# Patient Record
Sex: Male | Born: 1945 | Race: Black or African American | Hispanic: No | Smoking: Never smoker
Health system: Southern US, Community
[De-identification: ages and names within clinical notes are randomized; demographics above are authoritative.]

## PROBLEM LIST (undated history)

## (undated) DIAGNOSIS — M1990 Unspecified osteoarthritis, unspecified site: Secondary | ICD-10-CM

## (undated) DIAGNOSIS — I10 Essential (primary) hypertension: Secondary | ICD-10-CM

## (undated) HISTORY — PX: TOTAL KNEE ARTHROPLASTY: SHX125

## (undated) HISTORY — PX: JOINT REPLACEMENT: SHX530

---

## 2001-10-16 ENCOUNTER — Encounter: Payer: Self-pay | Admitting: Family Medicine

## 2001-10-16 ENCOUNTER — Ambulatory Visit (HOSPITAL_COMMUNITY): Admission: RE | Admit: 2001-10-16 | Discharge: 2001-10-16 | Payer: Self-pay | Admitting: Family Medicine

## 2003-04-04 ENCOUNTER — Ambulatory Visit (HOSPITAL_COMMUNITY): Admission: RE | Admit: 2003-04-04 | Discharge: 2003-04-04 | Payer: Self-pay | Admitting: Family Medicine

## 2003-05-08 ENCOUNTER — Ambulatory Visit (HOSPITAL_COMMUNITY): Admission: RE | Admit: 2003-05-08 | Discharge: 2003-05-08 | Payer: Self-pay | Admitting: Family Medicine

## 2003-05-09 ENCOUNTER — Ambulatory Visit (HOSPITAL_COMMUNITY): Admission: RE | Admit: 2003-05-09 | Discharge: 2003-05-09 | Payer: Self-pay | Admitting: Family Medicine

## 2003-10-02 ENCOUNTER — Ambulatory Visit (HOSPITAL_COMMUNITY): Admission: RE | Admit: 2003-10-02 | Discharge: 2003-10-02 | Payer: Self-pay | Admitting: General Surgery

## 2003-10-24 ENCOUNTER — Ambulatory Visit (HOSPITAL_COMMUNITY): Admission: RE | Admit: 2003-10-24 | Discharge: 2003-10-24 | Payer: Self-pay | Admitting: Family Medicine

## 2005-03-16 ENCOUNTER — Encounter (INDEPENDENT_AMBULATORY_CARE_PROVIDER_SITE_OTHER): Payer: Self-pay | Admitting: Family Medicine

## 2005-03-16 LAB — CONVERTED CEMR LAB
PSA: NORMAL ng/mL
Urinalysis: NEGATIVE

## 2005-06-01 ENCOUNTER — Ambulatory Visit: Payer: Self-pay | Admitting: Orthopedic Surgery

## 2005-07-05 ENCOUNTER — Encounter (INDEPENDENT_AMBULATORY_CARE_PROVIDER_SITE_OTHER): Payer: Self-pay | Admitting: *Deleted

## 2005-07-05 ENCOUNTER — Ambulatory Visit: Payer: Self-pay | Admitting: Orthopedic Surgery

## 2005-07-05 ENCOUNTER — Inpatient Hospital Stay (HOSPITAL_COMMUNITY): Admission: RE | Admit: 2005-07-05 | Discharge: 2005-07-12 | Payer: Self-pay | Admitting: Orthopedic Surgery

## 2005-07-11 ENCOUNTER — Ambulatory Visit: Payer: Self-pay | Admitting: Gastroenterology

## 2005-07-12 ENCOUNTER — Encounter (INDEPENDENT_AMBULATORY_CARE_PROVIDER_SITE_OTHER): Payer: Self-pay | Admitting: *Deleted

## 2005-07-18 ENCOUNTER — Ambulatory Visit: Payer: Self-pay | Admitting: Orthopedic Surgery

## 2005-07-28 ENCOUNTER — Emergency Department (HOSPITAL_COMMUNITY): Admission: EM | Admit: 2005-07-28 | Discharge: 2005-07-28 | Payer: Self-pay | Admitting: Emergency Medicine

## 2005-08-11 ENCOUNTER — Ambulatory Visit: Payer: Self-pay | Admitting: Orthopedic Surgery

## 2005-09-20 ENCOUNTER — Ambulatory Visit: Payer: Self-pay | Admitting: Family Medicine

## 2005-09-22 ENCOUNTER — Ambulatory Visit: Payer: Self-pay | Admitting: Orthopedic Surgery

## 2005-10-18 ENCOUNTER — Ambulatory Visit: Payer: Self-pay | Admitting: Family Medicine

## 2005-11-30 ENCOUNTER — Ambulatory Visit: Payer: Self-pay | Admitting: Family Medicine

## 2005-12-07 ENCOUNTER — Encounter: Payer: Self-pay | Admitting: Family Medicine

## 2005-12-07 DIAGNOSIS — Z86718 Personal history of other venous thrombosis and embolism: Secondary | ICD-10-CM

## 2005-12-07 DIAGNOSIS — E785 Hyperlipidemia, unspecified: Secondary | ICD-10-CM | POA: Insufficient documentation

## 2005-12-07 DIAGNOSIS — M1990 Unspecified osteoarthritis, unspecified site: Secondary | ICD-10-CM | POA: Insufficient documentation

## 2005-12-07 DIAGNOSIS — N200 Calculus of kidney: Secondary | ICD-10-CM | POA: Insufficient documentation

## 2005-12-07 DIAGNOSIS — E669 Obesity, unspecified: Secondary | ICD-10-CM

## 2005-12-07 DIAGNOSIS — I10 Essential (primary) hypertension: Secondary | ICD-10-CM | POA: Insufficient documentation

## 2005-12-07 DIAGNOSIS — K429 Umbilical hernia without obstruction or gangrene: Secondary | ICD-10-CM | POA: Insufficient documentation

## 2005-12-22 ENCOUNTER — Ambulatory Visit: Payer: Self-pay | Admitting: Orthopedic Surgery

## 2005-12-29 ENCOUNTER — Ambulatory Visit: Payer: Self-pay | Admitting: Family Medicine

## 2006-01-13 ENCOUNTER — Ambulatory Visit (HOSPITAL_COMMUNITY): Admission: RE | Admit: 2006-01-13 | Discharge: 2006-01-13 | Payer: Self-pay | Admitting: Family Medicine

## 2006-01-30 IMAGING — CR DG KNEE COMPLETE 4+V*R*
4 series · 4 of 4 positions shown · non-contrast
Comparison: none

CLINICAL DATA: Knee pain. No known injury
RIGHT KNEE FOUR VIEWS

[view not recorded (1 of 4)]
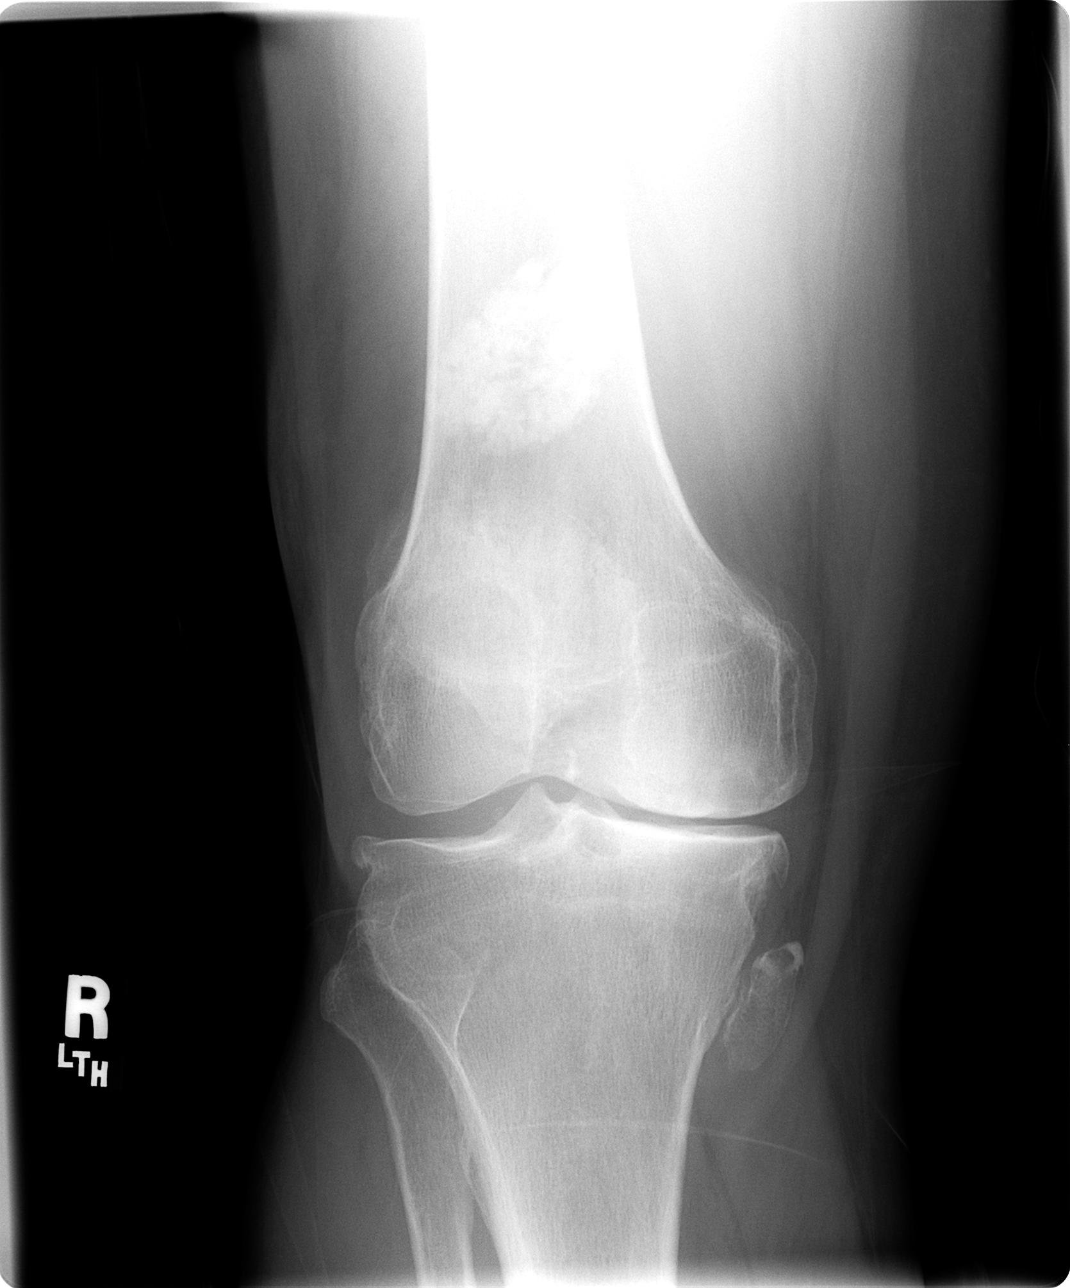

[view not recorded (2 of 4)]
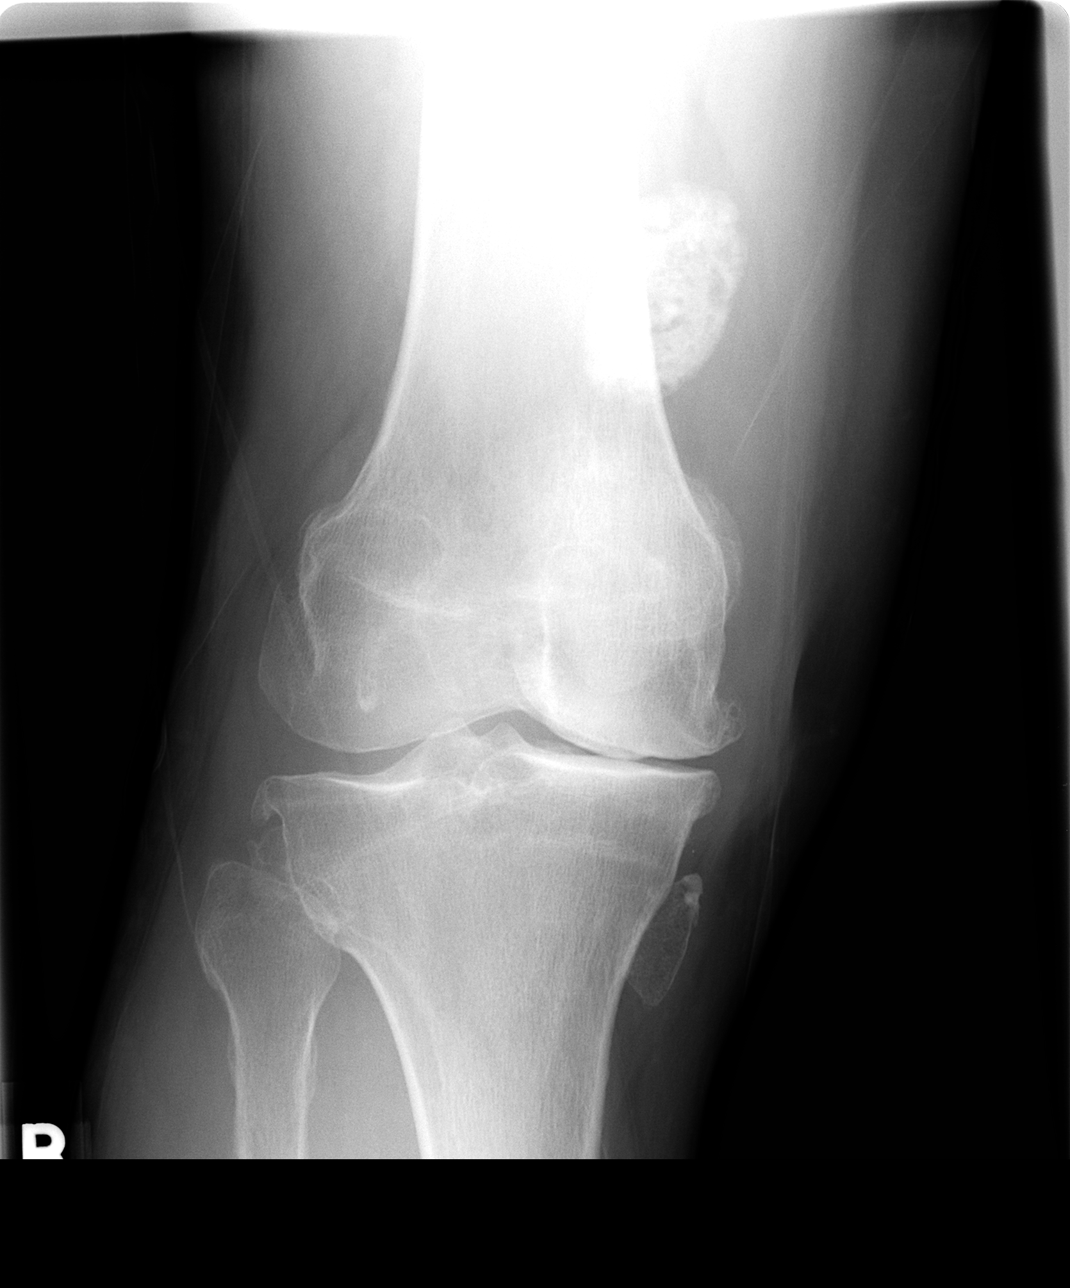

[view not recorded (3 of 4)]
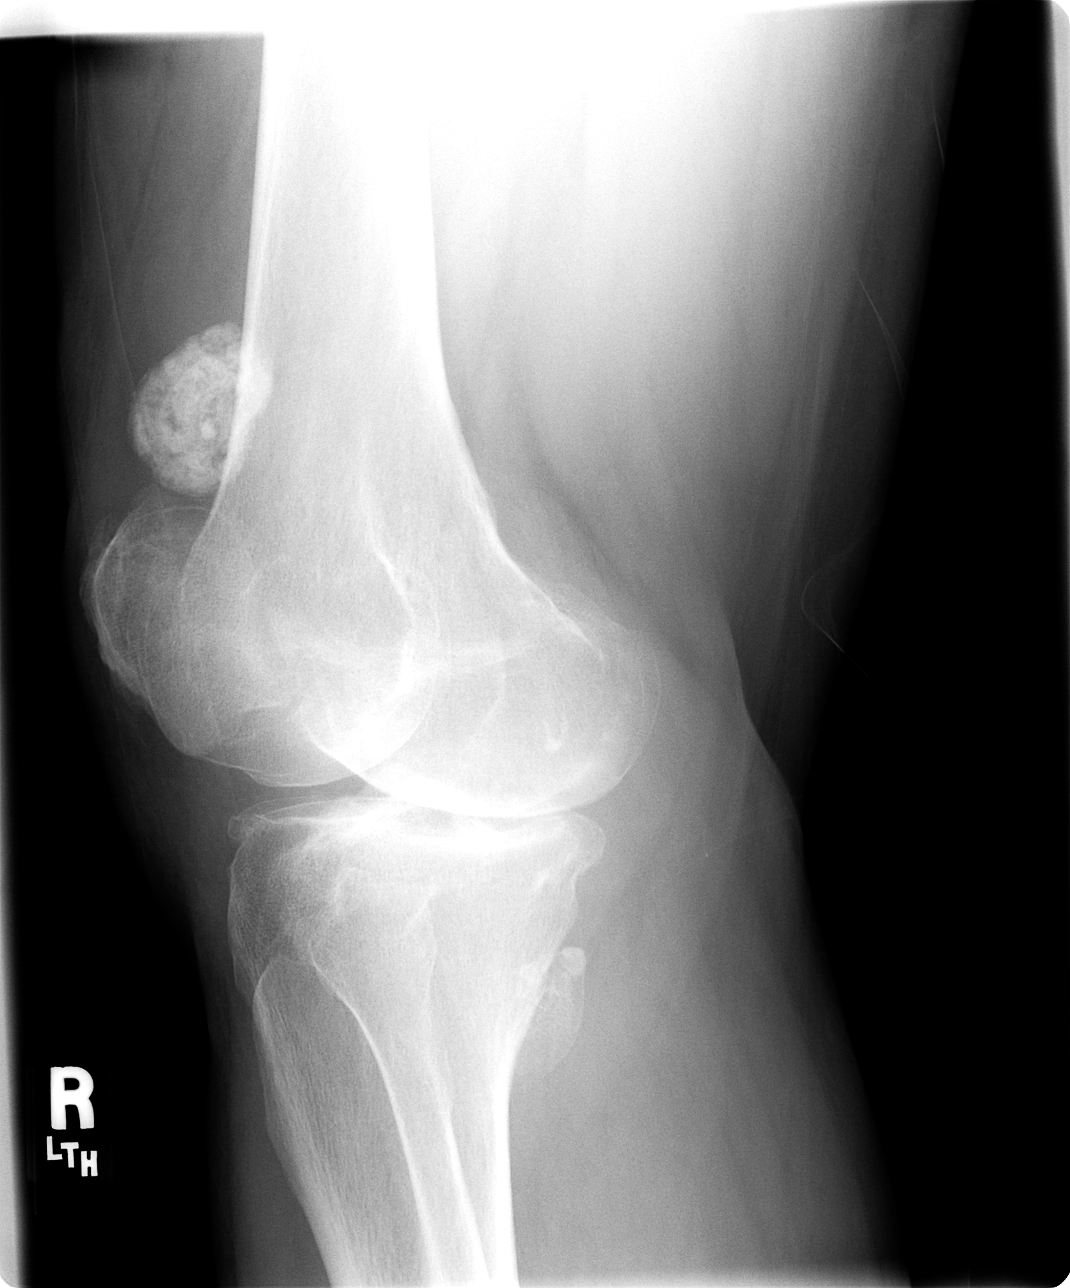

[view not recorded (4 of 4)]
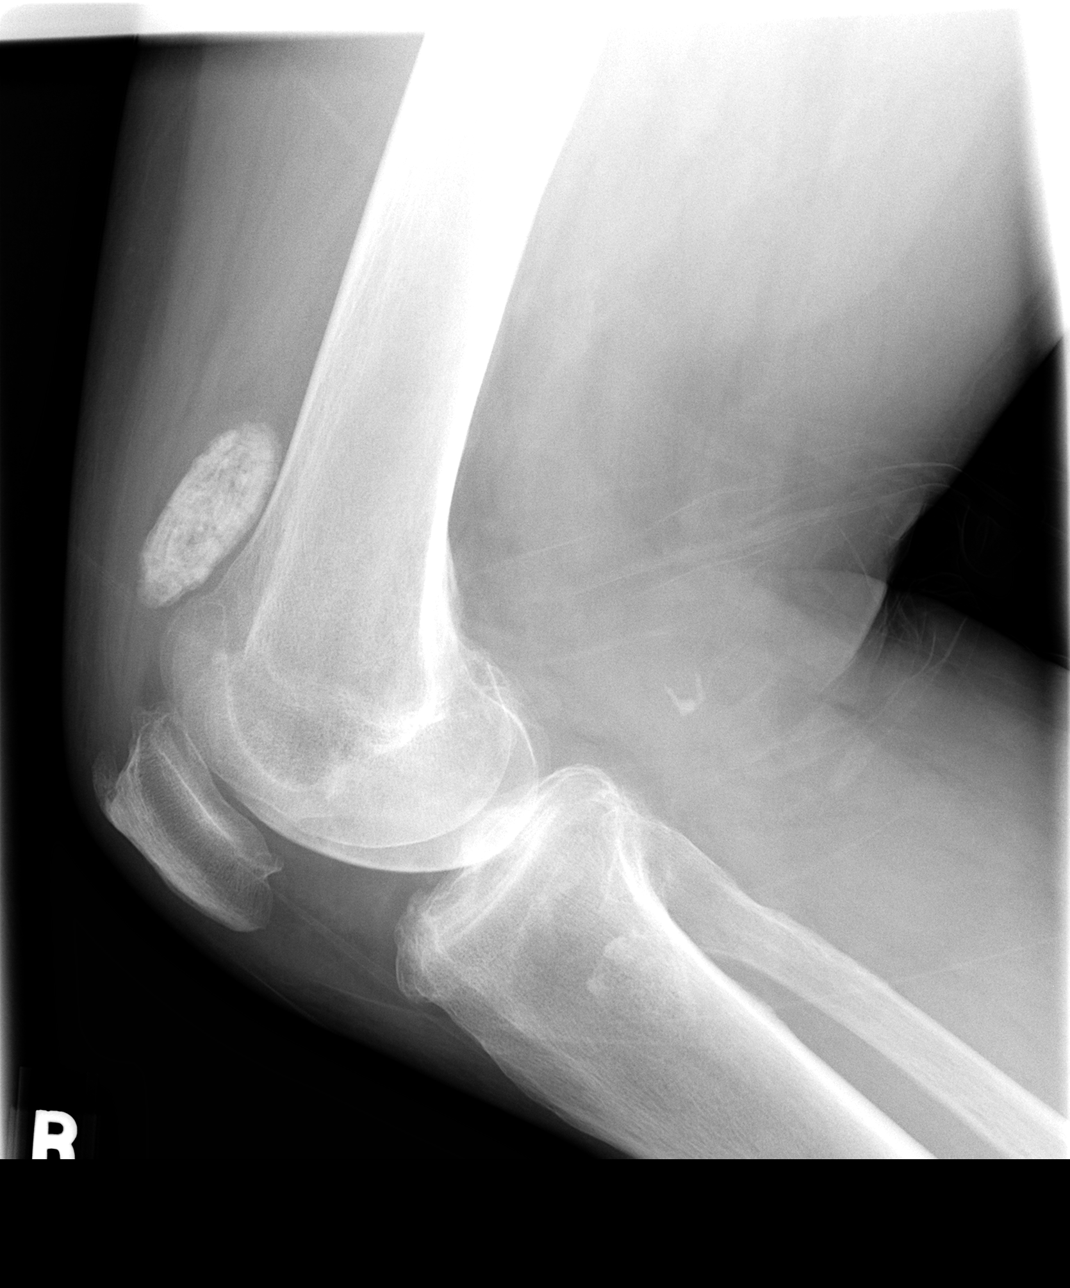

[4 of 4 positions shown; findings below may reference images not displayed]

FINDINGS: There is a 2 x 5 centimeters oval calcified structure in the suprapatellar region likely in the superior aspects of the joint. Moderate severe degenerative changes in the knee are noted manifested by joint space narrowing and osteophytosis primarily in the medial and patellofemoral compartments. Bony density along the medial proximal tibia is likely related to remote injury. There is a suggestion of osteochondral injury/defect in the medial femoral condyle. No definite acute abnormality identified.
IMPRESSION 
2 x 5 cm calcified structure in the superior aspect of the knee joint - likely large loose body. 
Suggestion of osteochondral defect in the medial femoral condyle.
Moderate-severe degenerative changes in the knee.
Evidence of previous remote injury along the medial tibia.

## 2006-02-15 ENCOUNTER — Ambulatory Visit: Payer: Self-pay | Admitting: Family Medicine

## 2006-02-15 ENCOUNTER — Ambulatory Visit (HOSPITAL_COMMUNITY): Admission: RE | Admit: 2006-02-15 | Discharge: 2006-02-15 | Payer: Self-pay | Admitting: Family Medicine

## 2006-02-20 ENCOUNTER — Ambulatory Visit (HOSPITAL_COMMUNITY): Admission: RE | Admit: 2006-02-20 | Discharge: 2006-02-20 | Payer: Self-pay | Admitting: Family Medicine

## 2006-02-20 ENCOUNTER — Encounter (INDEPENDENT_AMBULATORY_CARE_PROVIDER_SITE_OTHER): Payer: Self-pay | Admitting: Family Medicine

## 2006-03-08 ENCOUNTER — Telehealth (INDEPENDENT_AMBULATORY_CARE_PROVIDER_SITE_OTHER): Payer: Self-pay | Admitting: Family Medicine

## 2006-03-23 ENCOUNTER — Ambulatory Visit: Payer: Self-pay | Admitting: Orthopedic Surgery

## 2006-04-20 ENCOUNTER — Ambulatory Visit: Payer: Self-pay | Admitting: Family Medicine

## 2006-04-20 LAB — CONVERTED CEMR LAB
Cholesterol, target level: 200 mg/dL
HDL goal, serum: 40 mg/dL
LDL Goal: 130 mg/dL

## 2006-05-08 ENCOUNTER — Encounter (INDEPENDENT_AMBULATORY_CARE_PROVIDER_SITE_OTHER): Payer: Self-pay | Admitting: Family Medicine

## 2006-05-09 LAB — CONVERTED CEMR LAB
ALT: 16 units/L (ref 0–53)
Alkaline Phosphatase: 73 units/L (ref 39–117)
BUN: 13 mg/dL (ref 6–23)
Glucose, Bld: 82 mg/dL (ref 70–99)
HDL: 50 mg/dL (ref >39)
Potassium: 4.1 meq/L (ref 3.5–5.3)
Total Protein: 7.7 g/dL (ref 6.0–8.3)
Triglycerides: 51 mg/dL (ref <150)

## 2006-06-15 ENCOUNTER — Ambulatory Visit: Payer: Self-pay | Admitting: Family Medicine

## 2006-06-15 DIAGNOSIS — K219 Gastro-esophageal reflux disease without esophagitis: Secondary | ICD-10-CM

## 2006-09-20 ENCOUNTER — Ambulatory Visit: Payer: Self-pay | Admitting: Family Medicine

## 2006-09-21 ENCOUNTER — Telehealth (INDEPENDENT_AMBULATORY_CARE_PROVIDER_SITE_OTHER): Payer: Self-pay | Admitting: *Deleted

## 2006-09-21 LAB — CONVERTED CEMR LAB
ALT: 14 units/L (ref 0–53)
Basophils Absolute: 0.1 10*3/uL (ref 0.0–0.1)
Basophils Relative: 2 % — ABNORMAL HIGH (ref 0–1)
CO2: 21 meq/L (ref 19–32)
Calcium: 8.8 mg/dL (ref 8.4–10.5)
Chloride: 106 meq/L (ref 96–112)
Creatinine, Ser: 0.89 mg/dL (ref 0.40–1.50)
Eosinophils Absolute: 0.1 10*3/uL (ref 0.0–0.7)
Eosinophils Relative: 3 % (ref 0–5)
HCT: 41.9 % (ref 39.0–52.0)
Hemoglobin: 13.3 g/dL (ref 13.0–17.0)
LDL Cholesterol: 127 mg/dL — ABNORMAL HIGH (ref 0–99)
Lymphocytes Relative: 44 % (ref 12–46)
Lymphs Abs: 1.8 10*3/uL (ref 0.7–3.3)
MCHC: 31.7 g/dL (ref 30.0–36.0)
MCV: 90.3 fL (ref 78.0–100.0)
Monocytes Relative: 12 % — ABNORMAL HIGH (ref 3–11)
Neutrophils Relative %: 39 % — ABNORMAL LOW (ref 43–77)
Potassium: 3.7 meq/L (ref 3.5–5.3)
RDW: 13 % (ref 11.5–14.0)
Sodium: 139 meq/L (ref 135–145)
Triglycerides: 56 mg/dL (ref <150)
VLDL: 11 mg/dL (ref 0–40)

## 2006-09-25 ENCOUNTER — Encounter (INDEPENDENT_AMBULATORY_CARE_PROVIDER_SITE_OTHER): Payer: Self-pay | Admitting: Family Medicine

## 2006-11-10 ENCOUNTER — Ambulatory Visit: Payer: Self-pay | Admitting: Family Medicine

## 2006-11-10 DIAGNOSIS — M25539 Pain in unspecified wrist: Secondary | ICD-10-CM | POA: Insufficient documentation

## 2006-11-24 ENCOUNTER — Ambulatory Visit: Payer: Self-pay | Admitting: Family Medicine

## 2006-11-24 ENCOUNTER — Ambulatory Visit (HOSPITAL_COMMUNITY): Admission: RE | Admit: 2006-11-24 | Discharge: 2006-11-24 | Payer: Self-pay | Admitting: Family Medicine

## 2006-11-24 DIAGNOSIS — M25569 Pain in unspecified knee: Secondary | ICD-10-CM

## 2006-11-24 LAB — CONVERTED CEMR LAB
Bilirubin Urine: NEGATIVE
Blood in Urine, dipstick: NEGATIVE
Ketones, urine, test strip: NEGATIVE
Nitrite: NEGATIVE
Specific Gravity, Urine: 1.025
WBC Urine, dipstick: NEGATIVE

## 2006-11-25 LAB — CONVERTED CEMR LAB: Rhuematoid fact SerPl-aCnc: <20 intl units/mL (ref 0–20)

## 2006-11-27 ENCOUNTER — Telehealth (INDEPENDENT_AMBULATORY_CARE_PROVIDER_SITE_OTHER): Payer: Self-pay | Admitting: *Deleted

## 2006-12-04 ENCOUNTER — Encounter (INDEPENDENT_AMBULATORY_CARE_PROVIDER_SITE_OTHER): Payer: Self-pay | Admitting: Family Medicine

## 2006-12-15 ENCOUNTER — Encounter (INDEPENDENT_AMBULATORY_CARE_PROVIDER_SITE_OTHER): Payer: Self-pay | Admitting: Family Medicine

## 2006-12-22 ENCOUNTER — Ambulatory Visit: Payer: Self-pay | Admitting: Family Medicine

## 2007-03-22 ENCOUNTER — Ambulatory Visit: Payer: Self-pay | Admitting: Family Medicine

## 2007-03-23 ENCOUNTER — Encounter (INDEPENDENT_AMBULATORY_CARE_PROVIDER_SITE_OTHER): Payer: Self-pay | Admitting: Family Medicine

## 2007-03-23 ENCOUNTER — Telehealth (INDEPENDENT_AMBULATORY_CARE_PROVIDER_SITE_OTHER): Payer: Self-pay | Admitting: *Deleted

## 2007-03-23 LAB — CONVERTED CEMR LAB
BUN: 19 mg/dL (ref 6–23)
Creatinine, Ser: 1.17 mg/dL (ref 0.40–1.50)
Potassium: 3.9 meq/L (ref 3.5–5.3)

## 2007-04-19 ENCOUNTER — Ambulatory Visit: Payer: Self-pay | Admitting: Family Medicine

## 2007-07-19 ENCOUNTER — Ambulatory Visit: Payer: Self-pay | Admitting: Family Medicine

## 2007-07-19 LAB — CONVERTED CEMR LAB
Nitrite: NEGATIVE
Specific Gravity, Urine: 1.02
WBC Urine, dipstick: NEGATIVE

## 2007-08-28 ENCOUNTER — Encounter (INDEPENDENT_AMBULATORY_CARE_PROVIDER_SITE_OTHER): Payer: Self-pay | Admitting: Family Medicine

## 2007-10-18 ENCOUNTER — Ambulatory Visit: Payer: Self-pay | Admitting: Family Medicine

## 2007-10-19 LAB — CONVERTED CEMR LAB
ALT: 23 units/L (ref 0–53)
Albumin: 4.8 g/dL (ref 3.5–5.2)
Alkaline Phosphatase: 60 units/L (ref 39–117)
CO2: 25 meq/L (ref 19–32)
Creatinine, Ser: 1.2 mg/dL (ref 0.40–1.50)
Eosinophils Relative: 2 % (ref 0–5)
Glucose, Bld: 105 mg/dL — ABNORMAL HIGH (ref 70–99)
LDL Cholesterol: 141 mg/dL — ABNORMAL HIGH (ref 0–99)
Lymphocytes Relative: 45 % (ref 12–46)
Monocytes Absolute: 0.4 10*3/uL (ref 0.1–1.0)
Monocytes Relative: 8 % (ref 3–12)
Neutro Abs: 2.1 10*3/uL (ref 1.7–7.7)
Platelets: 212 10*3/uL (ref 150–400)
Potassium: 4.1 meq/L (ref 3.5–5.3)
Sodium: 136 meq/L (ref 135–145)
Total CHOL/HDL Ratio: 4
Total Protein: 8.5 g/dL — ABNORMAL HIGH (ref 6.0–8.3)
WBC: 4.8 10*3/uL (ref 4.0–10.5)

## 2007-11-14 ENCOUNTER — Ambulatory Visit: Payer: Self-pay | Admitting: Family Medicine

## 2007-11-14 DIAGNOSIS — R7309 Other abnormal glucose: Secondary | ICD-10-CM

## 2007-11-14 DIAGNOSIS — R799 Abnormal finding of blood chemistry, unspecified: Secondary | ICD-10-CM

## 2008-02-13 ENCOUNTER — Ambulatory Visit: Payer: Self-pay | Admitting: Family Medicine

## 2008-02-13 LAB — CONVERTED CEMR LAB
Barbiturate Quant, Ur: NEGATIVE
Benzodiazepines.: NEGATIVE
Cocaine Metabolites: NEGATIVE
Creatinine,U: 184.8 mg/dL
Methadone: NEGATIVE
Propoxyphene: NEGATIVE

## 2008-02-14 ENCOUNTER — Encounter (INDEPENDENT_AMBULATORY_CARE_PROVIDER_SITE_OTHER): Payer: Self-pay | Admitting: *Deleted

## 2008-02-14 LAB — CONVERTED CEMR LAB
ALT: 12 units/L (ref 0–53)
Albumin: 4.3 g/dL (ref 3.5–5.2)
BUN: 18 mg/dL (ref 6–23)
CO2: 27 meq/L (ref 19–32)
Calcium: 9.8 mg/dL (ref 8.4–10.5)
Chloride: 104 meq/L (ref 96–112)
Glucose, Bld: 103 mg/dL — ABNORMAL HIGH (ref 70–99)
Total CHOL/HDL Ratio: 3.3

## 2008-05-14 ENCOUNTER — Ambulatory Visit: Payer: Self-pay | Admitting: Family Medicine

## 2008-05-14 DIAGNOSIS — F528 Other sexual dysfunction not due to a substance or known physiological condition: Secondary | ICD-10-CM | POA: Insufficient documentation

## 2008-05-15 ENCOUNTER — Encounter (INDEPENDENT_AMBULATORY_CARE_PROVIDER_SITE_OTHER): Payer: Self-pay | Admitting: Family Medicine

## 2008-05-15 LAB — CONVERTED CEMR LAB: Uric Acid, Serum: 4.2 mg/dL (ref 4.0–7.8)

## 2008-08-25 ENCOUNTER — Ambulatory Visit: Payer: Self-pay | Admitting: Internal Medicine

## 2008-08-25 ENCOUNTER — Encounter (INDEPENDENT_AMBULATORY_CARE_PROVIDER_SITE_OTHER): Payer: Self-pay | Admitting: Family Medicine

## 2008-08-27 ENCOUNTER — Encounter: Payer: Self-pay | Admitting: Orthopedic Surgery

## 2008-08-27 ENCOUNTER — Ambulatory Visit (HOSPITAL_COMMUNITY): Admission: RE | Admit: 2008-08-27 | Discharge: 2008-08-27 | Payer: Self-pay | Admitting: Family Medicine

## 2008-09-01 ENCOUNTER — Ambulatory Visit: Payer: Self-pay | Admitting: Family Medicine

## 2008-09-04 ENCOUNTER — Ambulatory Visit: Payer: Self-pay | Admitting: Orthopedic Surgery

## 2008-09-04 DIAGNOSIS — M5126 Other intervertebral disc displacement, lumbar region: Secondary | ICD-10-CM

## 2008-09-10 ENCOUNTER — Telehealth (INDEPENDENT_AMBULATORY_CARE_PROVIDER_SITE_OTHER): Payer: Self-pay | Admitting: *Deleted

## 2008-09-10 ENCOUNTER — Telehealth: Payer: Self-pay | Admitting: Orthopedic Surgery

## 2008-10-06 ENCOUNTER — Ambulatory Visit: Payer: Self-pay | Admitting: Orthopedic Surgery

## 2008-10-09 ENCOUNTER — Encounter (INDEPENDENT_AMBULATORY_CARE_PROVIDER_SITE_OTHER): Payer: Self-pay | Admitting: *Deleted

## 2008-10-13 ENCOUNTER — Ambulatory Visit (HOSPITAL_COMMUNITY): Admission: RE | Admit: 2008-10-13 | Discharge: 2008-10-13 | Payer: Self-pay | Admitting: Orthopedic Surgery

## 2008-10-21 ENCOUNTER — Ambulatory Visit: Payer: Self-pay | Admitting: Orthopedic Surgery

## 2008-10-21 DIAGNOSIS — M4800 Spinal stenosis, site unspecified: Secondary | ICD-10-CM

## 2008-10-22 ENCOUNTER — Telehealth: Payer: Self-pay | Admitting: Orthopedic Surgery

## 2009-02-03 ENCOUNTER — Encounter: Payer: Self-pay | Admitting: Orthopedic Surgery

## 2010-02-02 NOTE — Letter (Signed)
Summary: *Orthopedic No Show Letter  [facility]  [address] [place] [address]  [place], [place] [address]   Phone: [phone]  Fax: [phone]     02/03/2009    Johnny Fischer [address] [place], [place]  [address]    Dear Mr. Johnny Fischer,   Our records indicate that you missed your scheduled appointment with Dr. Beaulah Corin on Tuesday, 01/20/2009.  Please contact this office to reschedule your appointment as soon as possible.  It is important that you keep your scheduled appointments with your physician, so we can provide you the best care possible.  We have enclosed an appointment card for your convenience.      Sincerely,    Dr. Terrance Mass, MD [facility] Phone [phone]

## 2010-02-02 NOTE — Miscellaneous (Signed)
Summary: PT order  PT order   Imported By: Cammie Sickle 04/30/2009 19:11:51  _____________________________________________________________________  External Attachment:    Type:   Image     Comment:   External Document

## 2010-05-21 NOTE — Consult Note (Signed)
NAMECAMPBELL, KRAY              ACCOUNT NO.:  [id number]   MEDICAL RECORD NO.:  [id number]          PATIENT TYPE:  INP   LOCATION:  A310                          FACILITY:  [facility]   PHYSICIAN:  Osvaldo Shipper, MD     DATE OF BIRTH:  01-12-1945   DATE OF CONSULTATION:  07/07/2005  DATE OF DISCHARGE:                                   CONSULTATION   REASON FOR CONSULTATION:  Shortness of breath.   REQUESTING PHYSICIAN:  Dr. Romeo Apple, orthopedic surgeon.   CHIEF COMPLAINT:  Shortness of breath, currently resolved.   HISTORY OF PRESENT ILLNESS:  Patient is a 65 year old African-American male  with history of hypertension, dyslipidemia, who was admitted to the hospital  and underwent a total right knee replacement on July 05, 2005.  This  procedure was done by Dr. Romeo Apple.  Patient has been doing well  postoperatively.  He was undergoing physical therapy today when he suddenly  became short of breath.  Did not have any chest pain.  This entire episode  lasted about 20 minutes.  According to the nurse, his saturations did not  drop.  His heart rate did not go up.  It is unclear what happened to his  blood pressure during this episode.  Patient was apparently given a  nebulizer treatment.  Subsequently, he felt better and currently he says he  is almost back to normal.  He does look very anxious at this time.  He says  he has never had this kind of problem before.  He said that he felt like he  was having an asthma attack, though he does not have history of asthma.  No  history of heart disease.  No history of lung disease.  Patient does not  smoke, has never smoked.  He denies any other discomfort anywhere else.  Denies any lightheadedness or any syncopal episodes.   MEDICATION AT HOME INCLUDE THE FOLLOWING:  1.  Gabapentin 100 mg b.i.d.  2.  Famotidine 20 mg daily.  3.  Ibuprofen 800 mg as needed.  4.  Lovastatin 10 mg daily.  5.  Lisinopril/HCTZ 20 mg once daily.   CURRENT  MEDICATIONS AT THE HOSPITAL INCLUDE:  1.  Albuterol nebulizer treatment q.8 h.  2.  Colace 100 mg b.i.d.  3.  Enoxaparin for DVT prophylaxis twice a day.  4.  Famotidine 20 mg once a day.  5.  Ferrous sulfate 325 mg t.i.d.  6.  Gabapentin 100 mg b.i.d.  7.  HCTZ 25 mg daily.  8.  Lisinopril 20 mg daily.  9.  Lovastatin 10 mg daily.  10. Potassium chloride 10 mEq b.i.d.  11. Senokot 1 tablet q.h.s.  12. He is on lactated Ringer's 30 cc per hour and he is on p.r.n.      medications.   ALLERGIES:  NO KNOWN DRUG ALLERGIES.   PAST MEDICAL HISTORY:  Hypertension, dyslipidemia, and arthritis involving  his knees and his back.  He has never had any surgeries prior to his current  surgery.   SOCIAL HISTORY:  He lives in [place].  Lives alone.  Does  not smoke.  No  illicit drug use.  No alcohol use.  His PMD is Dr. Loleta Chance.   FAMILY HISTORY:  Mother has diabetes, otherwise nothing remarkable.   REVIEW OF SYSTEMS:  Unremarkable except as mentioned in the HPI.   PHYSICAL EXAMINATION:  VITAL SIGNS:  Temperature 98.8, heart rate in the  80s, respiratory rate about 16, saturations 100% on 2 liters, blood pressure  137/66.  GENERAL EXAM:  This is an obese male, very anxious but in no distress.  HEENT:  There is no pallor, no icterus.  Oral mucous membranes moist.  No  oral lesions are noted at this time.  LUNGS:  Actually clear to auscultation bilaterally with wheezes, rales, or  rhonchi.  CARDIOVASCULAR:  S1, S2 is normal, regular.  No murmurs appreciated.  No S3,  S4.  No rubs heard.  ABDOMEN:  Soft, nontender, nondistended.  Bowel sounds are present.  No mass  or organomegaly appreciated.  EXTREMITIES:  Right leg appears to be mildly more swollen than the left;  that is the operative site, though.  His knee is covered in a dressing.  He  does have mild edema bilaterally.  He does have some calf discomfort on the  right side.   LABORATORY DATA:  His white count today is 11.8; was 9.3  yesterday.  Hemoglobin 11.8, as well.  MCV is 90, platelet count 152.  Sodium is 130,  potassium is 3.5, chloride is 92, bicarb 30, glucose 107, BUN is 8,  creatinine 1.1, calcium is 8.4.  ABG is not available.   He did have a chest x-ray which reveals low lung volumes with crowding of  pulmonary vasculature.  Radiologist commented that cephalization of blood  flow may indicate pulmonary venous hypertension, otherwise negative.   CT chest has been ordered by Dr. Romeo Apple, which is pending.   EKG has been done which shows sinus rhythm with a normal axis, no Q waves  are present.  There is some poor R wave progression, interval are normal, no  ST or T wave changes of concern are appreciated.   IMPRESSION:  This is a 65 year old African-American male with hypertension,  dyslipidemia, osteoarthritis who is status post right knee replacement for  severe arthritis who had an episode of shortness of breath, which has  resolved spontaneously.  Etiology is unclear at this time.  Differential  include acute chronic obstructive pulmonary disease/acute pulmonary  embolism/panic attack.  His right leg extremity swelling is a little bit  concerning for deep venous thrombosis, although he does not have any real  erythema at this time.  Cardiac etiology for his symptom is less likely as  his lungs are clear at this time and he has not been given any diuretics.   PLAN:  Shortness of breath:  We will await the CT chest results.  In the  meantime, I will obtain a BN peptide, 1 set of cardiac panel, and a D-dimer.  Depending on the CT, we may consider proceeding with a Doppler study of the  right lower extremity as well.  Nebulizer treatment is okay at this time.  Depending on the blood work, further decisions will be taken.  Agree with  DVT prophylaxis, which is ongoing.  His antihypertensive agent should also  be continued as it is being done.  We will continue to follow this patient along with you  and make further  recommendations as needed.  We appreciate Dr. Romeo Apple for allowing [place] to  see this patient.  Osvaldo Shipper, MD  Electronically Signed     GK/MEDQ  D:  07/07/2005  T:  07/07/2005  Job:  [id number]   cc:   Vickki Hearing, M.D.  Fax: [phone]   Annia Friendly. Loleta Chance, MD  Fax: [phone]

## 2010-05-21 NOTE — Op Note (Signed)
NAMECASSON, CATENA              ACCOUNT NO.:  [id number]   MEDICAL RECORD NO.:  [id number]          PATIENT TYPE:  AMB   LOCATION:  DAY                           FACILITY:  [facility]   PHYSICIAN:  Vickki Hearing, M.D.DATE OF BIRTH:  [birth year]   DATE OF PROCEDURE:  07/05/2005  DATE OF DISCHARGE:                                 OPERATIVE REPORT   HISTORY:  A 65 year old male with end-stage osteoarthritis right knee.  He  had complained of grinding sensation in the knee, loss of motion and  inability to perform activities of daily living.  He is been on Vicodin and  analgesics and pain has not been relieved.  He presents for total knee  replacement.   PREOPERATIVE DIAGNOSIS:  Osteoarthritis right knee.   POSTOPERATIVE DIAGNOSIS:  Osteoarthritis right knee.   PROCEDURE:  Right total knee replacement.   IMPLANTS:  Size 5 femur, size 5 tibia 41 three-peg oval dome patella, size  12-5 rotating platform posterior stabilized tibial insert, bone cement.   Inserted Sensorcaine 0.25% pain pump with 300 mL run at 2 mL/hour basal  rate.   OPERATIVE FINDINGS:  There was a large loose body in suprapatellar pouch.  There was severe bone-on-bone changes on the medial compartment, mild  changes lateral and patellofemoral joint.   SURGEON:  Vickki Hearing, M.D.   ASSISTANT:  Adella Hare.   Spinal anesthetic.   SPECIMENS:  Bone fragments sent to path.   COMPLICATIONS:  None.   BLOOD LOSS:  Minimal.   The patient to PACU in good condition.  All counts were reported as correct.   Mr. Hedman right knee was marked for surgery, countersigned by me the  surgeon.  History and physical was updated.  Antibiotics were started.  He  was taken to the operating room for spinal anesthetic.  Foley catheter was  inserted.  Right knee was prepped and draped using sterile technique.  Time-  out procedure was completed.   A straight incision was made with the knee in flexion over the  patella and  deepened through the subcutaneous tissue down to the extensor mechanism and  it was extended proximally and distally.  A medial arthrotomy was performed,  patella was everted.  Medial and lateral menisci, ACL, PCL were resected.  Tibia was subluxated forward.  A 10 mm cut from the lateral side was  referenced from the lateral side in neutral position was made using an  oscillating saw and the tibia measured a size 5.   The femur was drilled through the intramedullary canal with 3/8 inch drill  bit, decompressed with suction and an irrigation.  The femur was measured to  a size 5, distal cut and four cuts were made referencing 10 mm from the  distal femur followed by measuring the femur and then making the four  additional cuts.  Posterior compartment was decompressed with osteotome and  bone fragments were removed.  Extension and flexion gaps were checked and  found to be approximately 12.5 in equal.   The box cut was made after gaps were checked and then patella was  cut from a  26 down to a 14 using a 41 patella.  Three holes were drilled in the patella  and a trial reduction was performed.  I was happy with the stability and  then did a trial reduction using the rotating platform evaluation that was  stable so we drilled and punched the tibia, removed the trial and implants  and irrigated the joint dried the bone, cemented the implants in place and  waited for them to cure.  After the curing we placed a 12.5 posterior  stabilized insert and checked the range of motion with the knee with the  patella stabilized and found to tracking to be normal, stability to be very  good.   We then closed over a pain pump catheter with #1 Bralon with the knee in  flexion.  We used interrupted and running suture.  We 0-0 and 2-0 to close  the subcu and staples to close the skin.  Sterile dressing cryo cuff along  with Ace bandages were applied from the toes to the groin.  The patient  was  taken recovery room in stable condition.  Postop protocol will be routine.      Vickki Hearing, M.D.  Electronically Signed     SEH/MEDQ  D:  07/05/2005  T:  07/05/2005  Job:  [id number]

## 2010-05-21 NOTE — Group Therapy Note (Signed)
NAMELENFORD, BEDDOW              ACCOUNT NO.:  [id number]   MEDICAL RECORD NO.:  [id number]          PATIENT TYPE:  INP   LOCATION:  A310                          FACILITY:  [facility]   PHYSICIAN:  Vickki Hearing, M.D.DATE OF BIRTH:  [birth year]   DATE OF PROCEDURE:  DATE OF DISCHARGE:                                   PROGRESS NOTE   Postop:  Day 4  Right total knee.  Developed right lower extremity DVT documented by  ultrasound.   Medical consult had been obtained for shortness of breath and patient has  been started on a Lovenox/Coumadin protocol.  His potassium is 3.8.  Hemoglobin is 9.6.  Wound looks good.  He is progressing reasonably well  into physical therapy considering his DVT.  He is ambulatory with a walker  and assistance from therapy, and his plan is to go to rehab on Monday.      Vickki Hearing, M.D.  Electronically Signed     SEH/MEDQ  D:  07/09/2005  T:  07/09/2005  Job:  [id number]

## 2010-05-21 NOTE — Discharge Summary (Signed)
NAMETRINIDAD, PETRON              ACCOUNT NO.:  [id number]   MEDICAL RECORD NO.:  [id number]          PATIENT TYPE:  INP   LOCATION:  A310                          FACILITY:  [facility]   PHYSICIAN:  Vickki Hearing, M.D.DATE OF BIRTH:  Oct 25, 1945   DATE OF ADMISSION:  07/05/2005  DATE OF DISCHARGE:  07/09/2007LH                                 DISCHARGE SUMMARY   ADMITTING DIAGNOSIS:  Osteoarthritis right knee.   DISCHARGE DIAGNOSES:  1.  Osteoarthritis right knee.  2.  Deep vein thrombosis right lower extremity.   HISTORY:  Johnny Fischer is 65 years old.  Major medical problems are  hypertension and hypercholesterolemia.  No previous surgery.  He is widowed  and disabled.  Family physician is Dr. Loleta Chance.   He presented with right knee pain, which was greater than left and  osteoarthritis of the knee with end-stage disease and severe pain and  inability to perform activities of daily living in a normal manner.   He was admitted, as I said on July 05, 2005.  He had uncomplicated right  total knee replacement using a DePuy 5 femur and tibia, 41 3 ped oval dome  patella, size 12.5 rotating platform posterior stabilized tibial insert and  it was cemented.  This procedure went uncomplicated under spinal anesthetic.   In the postoperative period, the patient became short of breath on July 07, 2005 and was sent for a CT scan to rule out pulmonary embolus, which was  ruled out.  A medical consult was obtained.  An ultrasound was then obtained  and showed a DVT.  He was started on Lovenox, Coumadin protocol.  Currently,  his PT/INR are 18.6 with an INR of 1.5.  Sodium 129, potassium 4.7.  Hemoglobin is now 8.4.  He is ambulatory up to 100 feet.  CPM is set at 65  degrees.  Pain level varies between 7 to 10 on Tylox.   He will be discharged to a rehabilitation facility on the following  medications:  1.  Colace 100 mg b.i.d.  2.  Senokot 1 tablet p.o. q.h.s.  3.  Iron 325 t.i.d.  4.   Neurontin 100 mg b.i.d.  5.  Pepcid 20 mg daily.  6.  Lisinopril 20 mg daily.  7.  Hydrochlorothiazide 25 mg daily.  8.  Mevacor 10 mg daily.  9.  Potassium 20 mEq b.i.d.  10. Coumadin continue 5 mg daily, titrate up to INR 2.5.  11. Tylox 1 q.4h. p.r.n. pain.   Staples should come out on July 17, 2005.  Followup should be on July 20, 2005.  Phone number [phone] to schedule the time.      Vickki Hearing, M.D.  Electronically Signed     SEH/MEDQ  D:  07/11/2005  T:  07/11/2005  Job:  [id number]

## 2010-05-21 NOTE — Group Therapy Note (Signed)
Johnny Fischer, Johnny Fischer              ACCOUNT NO.:  [id number]   MEDICAL RECORD NO.:  [id number]          PATIENT TYPE:  INP   LOCATION:  A310                          FACILITY:  [facility]   PHYSICIAN:  Vickki Hearing, M.D.DATE OF BIRTH:  1945-10-31   DATE OF PROCEDURE:  07/07/2005  DATE OF DISCHARGE:                                   PROGRESS NOTE   This was his second postoperative day status post right total knee  replacement.  I saw the patient at approximately 1:30 to 1:40 p.m.  He was  doing fine, sitting in a chair, said he was feeling good.  He said his pain  was controlled.  I went to see another patient, and the patient then was  seen by the rapid response team for shortness of breath.  He was sent for a  CT scan with angiography to rule out pulmonary embolus, and he ruled out for  that.  His chest x-ray showed decreased lung volumes and air in his stomach,  although he complained of no abdominal pain and had no distention.   He recovered well and was 100% saturation on room air.  Blood gas is  pending.  His knee looked fine.  He did have some swelling in his right calf  but no tenderness.  We will continue observation.  We let him avoid any  further physical therapy today as his symptoms began when he sat down from a  standing position and became very short of breath and also very anxious.   ASSESSMENT:  Shortness of breath.   PLAN:  Observation after ruling out for a pulmonary embolus.      Vickki Hearing, M.D.  Electronically Signed     SEH/MEDQ  D:  07/07/2005  T:  07/07/2005  Job:  [id number]

## 2010-05-21 NOTE — Group Therapy Note (Signed)
Johnny Fischer, Johnny Fischer              ACCOUNT NO.:  [id number]   MEDICAL RECORD NO.:  [id number]          PATIENT TYPE:  INP   LOCATION:  A310                          FACILITY:  [facility]   PHYSICIAN:  Margaretmary Dys, M.D.DATE OF BIRTH:  [birth year]   DATE OF PROCEDURE:  07/10/2005  DATE OF DISCHARGE:                                   PROGRESS NOTE   SUBJECTIVE:  The patient feels well.  He has very minimal pain in the right  knee.  He has no fever or chills.  We were consulted on the patient due to  acute dyspnea post surgery.  His lung studies did not reveal any evidence of  PE, but he does have deep venous thrombosis for which he is being  anticoagulated for now.   OBJECTIVE:  Conscious, alert, comfortable, not in acute distress.  VITAL SIGNS:  Blood pressure 130/56, pulse of 76, respirations 18,  temperature 98.1.  Oxygen saturation was 97% on room air.  HEENT EXAM:  Normocephalic and atraumatic.  Oral mucosa was moist with no  exudate.  NECK:  Supple, no JVD.  LUNGS:  Clear clinically with good air entry bilaterally.  HEART:  S1-S2 regular, no S3, S4, gallops or murmurs.  ABDOMEN:  Obese, but soft, nontender, bowel sounds positive.  No mass  palpable.  EXTREMITIES:  Swollen right knee, consistent with postoperative changes.  He  also has bilateral pedal edema with right greater than left.   LABORATORY/DIAGNOSTIC DATA:  His white blood cell count was 12.2, hemoglobin  of 8.5, hematocrit 25.4, platelet count was 229 with no left shift.  PT  15.8, INR 1.2.  Sodium 131, potassium of 4, chloride of 92, CO2 of 31,  glucose of 122, BUN of 16, creatinine 1.1, calcium 8.4.   ASSESSMENT AND PLAN:  1.  Deep venous thrombosis of the right leg.  The patient is currently on      Lovenox and Coumadin.  I will give him a 10 mg dose of Coumadin today.      Hopefully the patient will be therapeutic prior to discharge.  2.  Anemia.  We will continue to monitor his H&H, especially in the setting      of full anticoagulation, but I think his drop is likely related to the      surgery.  3.  Acute shortness of breath.  This may have been related to an acute PE,      which spontaneously resolved or recanalized.  We will continue      anticoagulation.  The patient is currently stable.      Margaretmary Dys, M.D.  Electronically Signed     AM/MEDQ  D:  07/10/2005  T:  07/10/2005  Job:  [id number]

## 2010-05-21 NOTE — H&P (Signed)
Johnny Fischer, Johnny Fischer                          ACCOUNT NO.:  [id number]   MEDICAL RECORD NO.:  [id number]                   PATIENT TYPE:   LOCATION:                                       FACILITY:  [facility]   PHYSICIAN:  Dirk Dress. Katrinka Blazing, M.D.                DATE OF BIRTH:  May 16, 1945   DATE OF ADMISSION:  DATE OF DISCHARGE:                                HISTORY & PHYSICAL   HISTORY OF PRESENT ILLNESS:  [age]-year-old male referred for  colonoscopy because of guaiac-positive stools.  The patient denies having  overt rectal bleeding.  He was noted to have strongly guaiac-positive stools  on routine physical exam by his physician, Dr. Evlyn Courier.  He has a  family history of colon cancer.  He is therefore scheduled for screening  colonoscopy.   PAST HISTORY:  He has osteoarthritis, nocturnal leg cramps and hypertension.   MEDICATIONS:  1. Naprosyn 500 mg b.i.d.  2. Quinamm 260 mg q.h.s.   FAMILY HISTORY:  Family history is positive for colon cancer and leukemia.   REVIEW OF SYSTEMS:  Review of systems is positive for pain in his joints and  numbness of his toes.   ALLERGIES:  He has no known drug allergies.   PHYSICAL EXAMINATION:  GENERAL:  On examination, he is a pleasant male in no  acute distress.  VITAL SIGNS:  Blood pressure 120/84, pulse 80 and respirations 16.  Weight  227 pounds.  HEENT:  Unremarkable.  NECK:  Neck supple without JVD or bruit.  CHEST:  Chest clear to auscultation.  HEART:  Regular rate and rhythm without murmur, gallop or rub.  ABDOMEN:  Abdomen soft and nontender.  No masses.  RECTAL:  No masses.  Normal sphincter tone.  Strongly positive stool guaiac.  EXTREMITIES:  No cyanosis, clubbing or edema.  He has crepitus of both knees  but with good range of motion.  NEUROLOGIC:  No focal motor, sensory or cerebellar deficit.   IMPRESSION:  1. Guaiac-positive stools.  2. Family history of colon cancer.  3. Osteoarthritis.  4. Hypertension.   PLAN:  Screening colonoscopy.                                                Dirk Dress. Katrinka Blazing, M.D.    LCS/MEDQ  D:  11/27/2001  T:  11/28/2001  Job:  [id number]

## 2010-05-21 NOTE — Op Note (Signed)
Johnny Fischer, Johnny Fischer              ACCOUNT NO.:  [id number]   MEDICAL RECORD NO.:  [id number]          PATIENT TYPE:  INP   LOCATION:  A310                          FACILITY:  [facility]   PHYSICIAN:  Kassie Mends, M.D.      DATE OF BIRTH:  1945-10-12   DATE OF PROCEDURE:  07/12/2005  DATE OF DISCHARGE:                                 OPERATIVE REPORT   MEDICATIONS:  1.  Demerol 100 mg IV.  2.  Versed 6 mg IV.   INDICATIONS FOR PROCEDURE:  Johnny Fischer is a 65 year old male who was  admitted on July 05, 2005 for right knee replacement.  His hemoglobin trended  down after starting anticoagulation from 11.9 to 8.4.  He had a black stool  while on iron.   FINDINGS:  1.  Multiple gastric erosions, likely source of active oozing on full-      strength anticoagulation.  Biopsies obtained in the antrum for H pylori.  2.  Normal esophagus, duodenal bulb, and duodenum.   RECOMMENDATIONS:  1.  May restart Lovenox and Coumadin.  Would check CBC twice weekly and      transfuse as needed.  If active GI bleed occurs, strongly recommend      discontinuing Lovenox and Coumadin.  The benefits of anticoagulation      outweigh the low risk of significant GI bleed.  I discussed this with      his daughter.  2.  The patient needs a screening colonoscopy as an outpatient once he has      recovered from his right knee replacement.  3.  Follow up biopsies.   DESCRIPTION OF PROCEDURE:  A physical exam was performed, and informed  consent was obtained from the patient after explaining all of the risks  (perforation, bleeding, infection, and adverse effects to medicines),  benefits, and alternatives to the procedure which the patient appeared to  understand and so stated.  The patient was connected to the monitoring  device and placed in the left lateral position.  Continuous oxygen was  provided by nasal cannula and IV medications administered through an  indwelling cannula.  After administration of sedation  as outlined above, the  patient's esophagus was intubated, and the scope was advanced under direct  visualization to the second portion  of the duodenum.  The scope was subsequently removed slowly by carefully  examining the color, texture, anatomy, and integrity of the anatomy on the  way out.  The patient was recovered in the endoscopy suite and discharged to  the floor in satisfactory condition.      Kassie Mends, M.D.  Electronically Signed     SM/MEDQ  D:  07/12/2005  T:  07/12/2005  Job:  [id number]

## 2010-05-21 NOTE — H&P (Signed)
NAMEDEREK, LAUGHTER              ACCOUNT NO.:  [id number]   MEDICAL RECORD NO.:  [id number]          PATIENT TYPE:  AMB   LOCATION:  DAY                           FACILITY:  [facility]   PHYSICIAN:  Jerolyn Shin C. Katrinka Blazing, M.D.   DATE OF BIRTH:  March 20, 1945   DATE OF ADMISSION:  DATE OF DISCHARGE:  LH                                HISTORY & PHYSICAL   A 65 year old male with a history of guaiac positive stools, dating back to  2003.  Colonoscopy was scheduled at that time, but he never had it done.  Repeat examination reveals recurrent guaiac positive stool.  The patient has  a strong family history of colon cancer, his uncle, his father, and his  brother all died of metastatic colon cancer.  The patient is scheduled for  colonoscopy.   PAST HISTORY:  1.  Hypertension.  2.  Osteoarthritis.  3.  Hyperlipidemia.   SURGERY:  None.   MEDICATIONS:  Hydrochlorothiazide 12.5 mg every day.   SOCIAL HISTORY:  He is a widow, disabled.  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  He does not drink, smoke, or use  drugs.   __________  none.   ALLERGIES:  None.   PHYSICAL EXAMINATION:  VITAL SIGNS:  Blood pressure 120/70, pulse 84,  respirations 18, weight 223 pounds.  HEENT:  Unremarkable.  NECK:  Supple.  No JVD or bruit.  CHEST:  Clear to auscultation.  HEART:  Regular rate and rhythm without murmur, gallop or rub.  ABDOMEN:  Soft.  He has moderate tenderness in the left lower quadrant with  guarding.  There is a possible mass effect left lower quadrant.  RECTAL:  Reveals no masses.  Stool is guaiac negative.  EXTREMITIES:  There is no cyanosis, clubbing, or edema.  He has wide based  gait.  He has a tender, swollen, right knee with crepitus.  NEUROLOGIC:  Unremarkable.   IMPRESSION:  1.  Recurrent guaiac positive stool.  2.  Strong family history of metastatic colon cancer.  3.  Hypertension.  4.  Hyperlipidemia.  5.  Osteoarthritis.  6.  Lumbar disk  disease.   PLAN:  Colonoscopy.      LCS/MEDQ  D:  10/01/2003  T:  10/01/2003  Job:  [id number]

## 2010-05-21 NOTE — H&P (Signed)
[name], [name]              ACCOUNT NO.:  [id number]   MEDICAL RECORD NO.:  [id number]          PATIENT TYPE:  AMB   LOCATION:  DAY                           FACILITY:  [facility]   PHYSICIAN:  Vickki Hearing, M.D.DATE OF BIRTH:  [birth year]   DATE OF ADMISSION:  DATE OF DISCHARGE:  LH                                HISTORY & PHYSICAL   CHIEF COMPLAINT:  Right knee pain.   HISTORY:  Johnny Fischer is 65 years old.  He has medical problems of  hypertension and hypercholesterol.  Denies any previous surgery.  Reports  that he is widowed and disabled.  His family physician is Dr. Loleta Fischer.  He has  a family history of asthma, cancer, and diabetes.  He takes Lovastatin,  lisinopril, famotidine, and ibuprofen.  Does not smoke, drink, or use  caffeine, and has no allergies.   He presented with bilateral knee pain right greater than left, some  associated lower back pain which has been present for a year.  He has had  some relief with Vicodin twice a day.  He denies any injury, complains of  right knee swelling, and complains of grinding sensation in the right knee.  He also reports loss of motion and progressively worsening ability to  perform activities of daily living.   The patient has undergone a preoperative consultation regarding total knee  replacement.  He understands the risks and benefits of the procedure  including the risks of doing no surgery and continuing with pain.  He knows  that complications may occur including bleeding, infection, loosening,  instability, pulmonary embolus, DVT.  He understands the treatments for  these complications.   He is 231 pounds with a pulse of 60, respiratory rate of 16.  His HEENT was  essentially within normal limits.  His neck was supple without  lymphadenopathy.  Chest was clear.  Heart rate and rhythm was normal.  Abdomen was soft.  Cardiovascular, peripheral vascular examination showed  normal pulse and perfusion.  Skin was intact,  warm, and dry with no lesions  or open sores.  His neuro findings included normal reflexes, sensation,  coordination.  He was oriented x3.  He was alert and awake.  His right knee  examination revealed only 90 degrees of flexion, painful range of motion,  tenderness medial and lateral compartments, medial more than lateral with  bilateral varus deformity.  Gait shows a marked limp.   Radiographs taken in the office confirm that he does have varus knee with  osteoarthritis.   The patient has a right total knee replacement.      Vickki Hearing, M.D.  Electronically Signed     SEH/MEDQ  D:  07/04/2005  T:  07/04/2005  Job:  [id number]   cc:   [facility]  Fax: [phone]

## 2010-05-21 NOTE — Consult Note (Signed)
Johnny Fischer, Johnny Fischer              ACCOUNT NO.:  [id number]   MEDICAL RECORD NO.:  [id number]          PATIENT TYPE:  INP   LOCATION:  A310                          FACILITY:  [facility]   PHYSICIAN:  Kassie Mends, M.D.      DATE OF BIRTH:  [birth year]   DATE OF CONSULTATION:  07/12/2005  DATE OF DISCHARGE:                                   CONSULTATION   REASON FOR CONSULTATION:  Gastrointestinal bleed.   HPI:  Johnny Fischer is a 65 year old African-American male with a history of  osteoarthritis.  He was admitted on 07/02 for a right-knee replacement.  Prior to his surgery, he was on ibuprofen intermittently.  He denies any  alcohol use.  His postoperative course has been complicated by acute DVT,  which requires him to be on Lovenox and Coumadin.  His preoperative  hemoglobin was 13.3.  His postoperative hemoglobin was 10.8 to 11.8.  Over a  course of four to five days, his hemoglobin trended down to 8.4.  He had  been initiated on iron and there was some concern that he was having black  stools that looked like tar.  Johnny Fischer denies difficulty swallowing,  heartburn, indigestion or abdominal pain.  He denies constipation or  diarrhea.   PAST MEDICAL HISTORY:  Includes hypertension and hyperlipidemia.   PAST SURGICAL HISTORY:  Per the HPI.   ALLERGIES:  He is not allergic to any medicines.   CURRENT MEDICATIONS:  He is currently on Lovenox 100 mg subcu every 12 hours  and Coumadin 5 mg daily.  His additional medications include Albuterol,  Colace, Neurontin, hydrochlorothiazide, iron sulfate, lisinopril,  lovastatin, K-Dur, and Senokot.   FAMILY HISTORY:  He has no family history of colon cancer or colon polyps.   REVIEW OF SYSTEMS:  Reveals no prior colonoscopy.  His review of systems is  as previously mentioned, otherwise all systems are negative.   PHYSICAL EXAMINATION:  VITAL SIGNS:  He is afebrile and hemodynamically  stable.HEENT:  His pupils are equal and reactive to  light and his mouth has  no oral lesions.NECK:  His neck has full range of motion and no  lymphadenopathy.LUNGS:  Clear to auscultation bilaterally.CARDIOVASCULAR  EXAM:  Regular rhythm, no murmur, normal S1 and  S2.ABDOMEN:  Bowel sounds  are present, soft, nontender, nondistended.  No rebound or  guarding.EXTREMITIES:  He has edema of his right lower extremity.  He has no  clubbing, cyanosis or edema.NEUROLOGIC:  He has no focal neurologic  deficits.   LABORATORY DATA:  Platelets 356, INR 1.5.  Creatinine 1.2.   ASSESSMENT:  My assessment is Johnny Fischer is a 65 year old male with a  history of NSAID use and a slowly declining hemoglobin on full  anticoagulation.  The differential diagnosis includes gastritis, gastric  erosions, and a low likelihood of peptic ulcer disease or reflux  esophagitis.  Thank you for allowing me to see Johnny Fischer in consultation.  My list of recommendations follows.   RECOMMENDATIONS:  I recommend EGD for risk assessment due to need for long-  term anticoagulation therapy and decrease of his  hemoglobin on  anticoagulation.  I would hold the Lovenox and the Coumadin.  I would  continue to monitor hemodynamic parameters and would be more than happy to  scope him tonight, if evidence of active GI bleeding occurs.  He should have  colonoscopy for average risk screening as an outpatient when he has fully  recovered from his right-knee replacement.  I agree with continuing twice  daily PPI.      Kassie Mends, M.D.  Electronically Signed     SM/MEDQ  D:  07/12/2005  T:  07/12/2005  Job:  [id number]

## 2011-02-01 ENCOUNTER — Ambulatory Visit: Payer: Self-pay | Admitting: Urology

## 2011-05-17 ENCOUNTER — Ambulatory Visit (INDEPENDENT_AMBULATORY_CARE_PROVIDER_SITE_OTHER): Payer: Medicare Other | Admitting: Urology

## 2011-05-17 DIAGNOSIS — R972 Elevated prostate specific antigen [PSA]: Secondary | ICD-10-CM

## 2011-05-20 ENCOUNTER — Other Ambulatory Visit: Payer: Self-pay | Admitting: Urology

## 2011-05-20 DIAGNOSIS — R972 Elevated prostate specific antigen [PSA]: Secondary | ICD-10-CM

## 2011-05-25 ENCOUNTER — Other Ambulatory Visit (HOSPITAL_COMMUNITY): Payer: Medicare Other

## 2011-07-12 ENCOUNTER — Ambulatory Visit (HOSPITAL_COMMUNITY): Payer: Medicare Other

## 2011-08-30 ENCOUNTER — Ambulatory Visit (HOSPITAL_COMMUNITY): Admission: RE | Admit: 2011-08-30 | Payer: Medicare Other | Source: Ambulatory Visit

## 2014-01-08 ENCOUNTER — Ambulatory Visit (HOSPITAL_COMMUNITY): Admit: 2014-01-08 | Payer: Self-pay | Admitting: Gastroenterology

## 2014-01-08 ENCOUNTER — Encounter (HOSPITAL_COMMUNITY): Payer: Self-pay

## 2014-01-08 SURGERY — COLONOSCOPY WITH PROPOFOL
Anesthesia: Monitor Anesthesia Care

## 2019-11-08 ENCOUNTER — Other Ambulatory Visit: Payer: Self-pay

## 2019-11-08 ENCOUNTER — Ambulatory Visit
Admission: EM | Admit: 2019-11-08 | Discharge: 2019-11-08 | Disposition: A | Discharge status: Home / Self Care | Payer: Medicare HMO | Attending: Urgent Care | Admitting: Urgent Care

## 2019-11-08 DIAGNOSIS — M545 Low back pain, unspecified: Secondary | ICD-10-CM

## 2019-11-08 DIAGNOSIS — R339 Retention of urine, unspecified: Secondary | ICD-10-CM | POA: Diagnosis present

## 2019-11-08 DIAGNOSIS — M5137 Other intervertebral disc degeneration, lumbosacral region: Secondary | ICD-10-CM | POA: Diagnosis present

## 2019-11-08 DIAGNOSIS — R338 Other retention of urine: Secondary | ICD-10-CM | POA: Insufficient documentation

## 2019-11-08 DIAGNOSIS — Z87442 Personal history of urinary calculi: Secondary | ICD-10-CM | POA: Insufficient documentation

## 2019-11-08 DIAGNOSIS — N401 Enlarged prostate with lower urinary tract symptoms: Secondary | ICD-10-CM | POA: Diagnosis present

## 2019-11-08 DIAGNOSIS — M48061 Spinal stenosis, lumbar region without neurogenic claudication: Secondary | ICD-10-CM

## 2019-11-08 LAB — POCT URINALYSIS DIP (MANUAL ENTRY)
Bilirubin, UA: NEGATIVE
Glucose, UA: NEGATIVE mg/dL
Nitrite, UA: POSITIVE — AB
Protein Ur, POC: 100 mg/dL — AB
Spec Grav, UA: >=1.03 — AB (ref 1.010–1.025)
Urobilinogen, UA: 0.2 E.U./dL
pH, UA: 5.5 (ref 5.0–8.0)

## 2019-11-08 MED ORDER — TRAMADOL HCL 50 MG PO TABS: 50.0000 mg | ORAL_TABLET | Freq: Four times a day (QID) | ORAL | 0 refills | Status: DC | PRN

## 2019-11-08 MED ORDER — CIPROFLOXACIN HCL 500 MG PO TABS: 500.0000 mg | ORAL_TABLET | Freq: Two times a day (BID) | ORAL | 0 refills | Status: DC

## 2019-11-08 NOTE — Discharge Instructions (Signed)
Please schedule Tylenol at 500 mg - 650 mg once every 6 hours as needed for aches and pains.  If you still have pain despite taking Tylenol regularly, this is breakthrough pain.  You can use tramadol once every 6 hours for this.  Once your pain is better controlled, switch back to just Tylenol.  Please make sure you follow-up with your back doctor.  If you do not have one, you can contact the back specialist in [place].  Make sure you keep an appointment with [facility].

## 2019-11-08 NOTE — ED Provider Notes (Signed)
George  MRN: [id number] DOB: 26-May-1945  Subjective:   Johnny Fischer is a 74 y.o. male presenting for 2-day history of acute on chronic low back pain that radiates into either buttock.  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  He also has a history of renal colic, BPH, urinary retention.  Currently he is wearing a urinary catheter.  He is actually supposed to be seen at [facility] in [place].  He was prescribed Keflex at the time as well.  He is also on tamsulosin.  Takes Metformin.  Last blood sugar reading was 97 on 10/07/2019, GFR of 72.  No current facility-administered medications for this encounter.  Current Outpatient Medications:  .  tamsulosin (FLOMAX) 0.4 MG CAPS capsule, Take 0.8 mg by mouth daily., Disp: , Rfl:    No Known Allergies  History reviewed. No pertinent past medical history.   History reviewed. No pertinent surgical history.  Family History  Family history unknown: Yes    Social History   Tobacco Use  . Smoking status: Never Smoker  . Smokeless tobacco: Never Used  Substance Use Topics  . Alcohol use: Not Currently  . Drug use: Never    ROS   Objective:   Vitals: BP (!) 203/72   Pulse 66   Temp 98.3 F (36.8 C)   Resp 18   SpO2 94%   BP recheck 176/76.  Physical Exam Constitutional:      General: He is not in acute distress.    Appearance: Normal appearance. He is well-developed. He is obese. He is not ill-appearing, toxic-appearing or diaphoretic.  HENT:     Head: Normocephalic and atraumatic.     Right Ear: External ear normal.     Left Ear: External ear normal.     Nose: Nose normal.     Mouth/Throat:     Mouth: Mucous membranes are moist.     Pharynx: Oropharynx is clear.  Eyes:     General: No scleral icterus.    Extraocular Movements: Extraocular movements intact.     Pupils: Pupils are equal, round,  and reactive to light.  Cardiovascular:     Rate and Rhythm: Normal rate and regular rhythm.     Heart sounds: Normal heart sounds. No murmur heard.  No friction rub. No gallop.   Pulmonary:     Effort: Pulmonary effort is normal. No respiratory distress.     Breath sounds: Normal breath sounds. No stridor. No wheezing, rhonchi or rales.  Abdominal:     General: Bowel sounds are normal. There is no distension.     Palpations: Abdomen is soft. There is no mass.     Tenderness: There is no abdominal tenderness. There is no guarding or rebound.  Musculoskeletal:     Lumbar back: Spasms and tenderness (over areas outlined) present. No swelling, edema, deformity, signs of trauma, lacerations or bony tenderness. Decreased range of motion. Negative right straight leg raise test and negative left straight leg raise test. No scoliosis.       Back:     Comments: Strength 5/5 for lower extremities, symmetric.  Skin:    General: Skin is warm and dry.  Neurological:     Mental Status: He is alert and oriented to person, place, and time.     Motor: No weakness.     Coordination: Coordination normal.     Gait: Gait normal.  Psychiatric:  Mood and Affect: Mood normal.        Behavior: Behavior normal.        Thought Content: Thought content normal.        Judgment: Judgment normal.     Results for orders placed or performed during the hospital encounter of 11/08/19 (from the past 24 hour(s))  POCT urinalysis dipstick     Status: Abnormal   Collection Time: 11/08/19  6:34 PM  Result Value Ref Range   Color, UA brown (A) yellow   Clarity, UA turbid (A) clear   Glucose, UA negative negative mg/dL   Bilirubin, UA negative negative   Ketones, POC UA trace (5) (A) negative mg/dL   Spec Grav, UA >=1.030 (A) 1.010 - 1.025   Blood, UA large (A) negative   pH, UA 5.5 5.0 - 8.0   Protein Ur, POC =100 (A) negative mg/dL   Urobilinogen, UA 0.2 0.2 or 1.0 E.U./dL   Nitrite, UA Positive (A)  Negative   Leukocytes, UA Moderate (2+) (A) Negative    Assessment and Plan :   PDMP not reviewed this encounter.  1. Acute bilateral low back pain without sciatica   2. Spinal stenosis of lumbar region without neurogenic claudication   3. DDD (degenerative disc disease), lumbosacral   4. Urinary retention   5. History of renal stone   6. Benign prostatic hyperplasia with urinary retention     Suspect acute on chronic back pain, recommended scheduling Tylenol, tramadol for breakthrough pain.  Provided him with information to follow-up with back specialist.  Regarding his urinalysis, will cover for ongoing urinary tract infection secondary to urinary retention.  He failed treatment with Keflex and therefore will use ciprofloxacin, urine culture pending.  Emphasized need for follow-up with [facility]. Counseled patient on potential for adverse effects with medications prescribed/recommended today, ER and return-to-clinic precautions discussed, patient verbalized understanding.    Jaynee Eagles, [place] 11/08/19 1838

## 2019-11-08 NOTE — ED Triage Notes (Signed)
Pt presents with c/o low back pain that began 2 days ago, denies injury

## 2019-11-10 LAB — URINE CULTURE: Culture: >=100000 — AB

## 2019-11-11 LAB — URINE CULTURE

## 2019-12-04 DIAGNOSIS — O2230 Deep phlebothrombosis in pregnancy, unspecified trimester: Secondary | ICD-10-CM

## 2019-12-04 HISTORY — DX: Deep phlebothrombosis in pregnancy, unspecified trimester: O22.30

## 2019-12-13 ENCOUNTER — Encounter (HOSPITAL_COMMUNITY): Payer: Self-pay | Admitting: *Deleted

## 2019-12-13 ENCOUNTER — Inpatient Hospital Stay (HOSPITAL_COMMUNITY)
Admission: EM | Admit: 2019-12-13 | Discharge: 2019-12-13 | DRG: 726 | Discharge status: Against Medical Advice | Payer: Medicare Other | Attending: Family Medicine | Admitting: Family Medicine

## 2019-12-13 ENCOUNTER — Ambulatory Visit
Admission: EM | Admit: 2019-12-13 | Discharge: 2019-12-13 | Disposition: A | Discharge status: Home / Self Care | Payer: Medicare Other | Source: Home / Self Care

## 2019-12-13 ENCOUNTER — Other Ambulatory Visit: Payer: Self-pay

## 2019-12-13 ENCOUNTER — Encounter: Payer: Self-pay | Admitting: Emergency Medicine

## 2019-12-13 ENCOUNTER — Emergency Department (HOSPITAL_COMMUNITY): Payer: Medicare Other

## 2019-12-13 DIAGNOSIS — N401 Enlarged prostate with lower urinary tract symptoms: Secondary | ICD-10-CM | POA: Diagnosis not present

## 2019-12-13 DIAGNOSIS — G834 Cauda equina syndrome: Secondary | ICD-10-CM | POA: Diagnosis present

## 2019-12-13 DIAGNOSIS — Z79899 Other long term (current) drug therapy: Secondary | ICD-10-CM

## 2019-12-13 DIAGNOSIS — Z5329 Procedure and treatment not carried out because of patient's decision for other reasons: Secondary | ICD-10-CM | POA: Diagnosis not present

## 2019-12-13 DIAGNOSIS — M48061 Spinal stenosis, lumbar region without neurogenic claudication: Secondary | ICD-10-CM | POA: Diagnosis present

## 2019-12-13 DIAGNOSIS — M545 Low back pain, unspecified: Secondary | ICD-10-CM | POA: Diagnosis present

## 2019-12-13 DIAGNOSIS — R338 Other retention of urine: Secondary | ICD-10-CM | POA: Diagnosis present

## 2019-12-13 DIAGNOSIS — M1990 Unspecified osteoarthritis, unspecified site: Secondary | ICD-10-CM | POA: Diagnosis not present

## 2019-12-13 DIAGNOSIS — N3945 Continuous leakage: Secondary | ICD-10-CM | POA: Insufficient documentation

## 2019-12-13 DIAGNOSIS — R339 Retention of urine, unspecified: Secondary | ICD-10-CM

## 2019-12-13 DIAGNOSIS — Z20822 Contact with and (suspected) exposure to covid-19: Secondary | ICD-10-CM | POA: Diagnosis present

## 2019-12-13 HISTORY — DX: Unspecified osteoarthritis, unspecified site: M19.90

## 2019-12-13 LAB — ACETAMINOPHEN LEVEL: Acetaminophen (Tylenol), Serum: 19 ug/mL (ref 10–30)

## 2019-12-13 LAB — RESP PANEL BY RT-PCR (FLU A&B, COVID) ARPGX2
Influenza A by PCR: NEGATIVE
Influenza B by PCR: NEGATIVE
SARS Coronavirus 2 by RT PCR: NEGATIVE

## 2019-12-13 LAB — URINALYSIS, ROUTINE W REFLEX MICROSCOPIC
Bilirubin Urine: NEGATIVE
Glucose, UA: NEGATIVE mg/dL
Hgb urine dipstick: NEGATIVE
Ketones, ur: NEGATIVE mg/dL
Leukocytes,Ua: NEGATIVE
Nitrite: NEGATIVE
Protein, ur: NEGATIVE mg/dL
Specific Gravity, Urine: 1.012 (ref 1.005–1.030)
pH: 5 (ref 5.0–8.0)

## 2019-12-13 LAB — POCT URINALYSIS DIP (MANUAL ENTRY)
Bilirubin, UA: NEGATIVE
Glucose, UA: NEGATIVE mg/dL
Ketones, POC UA: NEGATIVE mg/dL
Leukocytes, UA: NEGATIVE
Nitrite, UA: NEGATIVE
Protein Ur, POC: NEGATIVE mg/dL
Spec Grav, UA: 1.02 (ref 1.010–1.025)
Urobilinogen, UA: 0.2 E.U./dL
pH, UA: 5.5 (ref 5.0–8.0)

## 2019-12-13 LAB — BASIC METABOLIC PANEL
Anion gap: 12 (ref 5–15)
BUN: 13 mg/dL (ref 8–23)
CO2: 25 mmol/L (ref 22–32)
Calcium: 9.7 mg/dL (ref 8.9–10.3)
Chloride: 102 mmol/L (ref 98–111)
Creatinine, Ser: 1.25 mg/dL — ABNORMAL HIGH (ref 0.61–1.24)
GFR, Estimated: >60 mL/min (ref >60)
Glucose, Bld: 108 mg/dL — ABNORMAL HIGH (ref 70–99)
Potassium: 4.1 mmol/L (ref 3.5–5.1)
Sodium: 139 mmol/L (ref 135–145)

## 2019-12-13 LAB — CBC
HCT: 42.1 % (ref 39.0–52.0)
Hemoglobin: 13.1 g/dL (ref 13.0–17.0)
MCH: 28.2 pg (ref 26.0–34.0)
MCHC: 31.1 g/dL (ref 30.0–36.0)
MCV: 90.7 fL (ref 80.0–100.0)
Platelets: 194 10*3/uL (ref 150–400)
RBC: 4.64 MIL/uL (ref 4.22–5.81)
RDW: 13.1 % (ref 11.5–15.5)
WBC: 10.4 10*3/uL (ref 4.0–10.5)
nRBC: 0 % (ref 0.0–0.2)

## 2019-12-13 MED ORDER — GADOBUTROL 1 MMOL/ML IV SOLN
10.0000 mL | Freq: Once | INTRAVENOUS | Status: AC | PRN
  Administered 2019-12-13: 10 mL via INTRAVENOUS

## 2019-12-13 MED ORDER — DEXAMETHASONE SODIUM PHOSPHATE 10 MG/ML IJ SOLN
12.0000 mg | Freq: Once | INTRAMUSCULAR | Status: AC
  Administered 2019-12-13: 12 mg via INTRAVENOUS
  Filled 2019-12-13: qty 2

## 2019-12-13 NOTE — ED Notes (Signed)
Pt in MRI at this time 

## 2019-12-13 NOTE — ED Triage Notes (Addendum)
Lower abd pain, burning with urination , urinary frequency and low back pain since yesterday.  Pt had urinary catheter in place for past 3 months  for urinary retention.  Pt states he removed the catheter himself x 1 month ago

## 2019-12-13 NOTE — ED Triage Notes (Signed)
Urinary incontinence with flank pain onset yesterday

## 2019-12-13 NOTE — ED Notes (Signed)
ED Provider at bedside. 

## 2019-12-13 NOTE — ED Notes (Signed)
Hospitalist at bedside 

## 2019-12-13 NOTE — H&P (Incomplete)
TRH H&P    Patient Demographics:    Johnny Fischer, is a 74 y.o. male  MRN: [id number]  DOB - Oct 19, 1945  Admit Date - 12/13/2019  Referring MD/NP/PA: Donna Christen  Outpatient Primary MD for the patient is Patient, No Pcp Per  Patient coming from: Home  Chief complaint-back pain   HPI:    Johnny Fischer  is a 74 y.o. male, with history of arthritis presents to the ER with a chief complaint of back pain.  Patient has caretaker at bedside who believes that pain started when patient was helping to push a truck up onto a ramp.  Patient does admit that the pain did start in the same day which was 2 days ago.  He cannot characterize the pain and reports it is just hurting real bad.  It was a 10 out of 10 when it started but has improved since being in the ER.  He reports associated saddle anesthesia.  He took 6 Tylenol at one time, and reports that the pain was improved as well as the saddle anesthesia.  The numbness did not recur, but the pain came back today so patient went to the urgent care.  Urgent care sent him here.  Patient reports that he has had no bowel incontinence.  He reports urinary incontinence described as dribbling.  He reports dysuria that started yesterday.  He does not report decreased urinary output, even though there is clear urinary retention with a bladder scan of greater than 1 L, and 1.5 L out with Foley catheter.  Patient reports that he has been walking normally.  He has normal sensation in the entirety of his legs at this time.  He reports he does drag his toe greater on the left than right, but that that is chronic.  Patient has no other complaints at this time.  In the ED Temp 98.2, heart rate 66-72, respiratory rate 18-20, blood pressure 157/74, 96% on room air Hematology showed leukocytosis with a white blood cell count of 10.4, hemoglobin 13.1 Chemistry panel reveals a bump in creatinine at  1.25-last creatinine was 0.93 but this was in 2010.  1.25 is likely his new baseline MRI lumbar showed a T1 hypodense enhancing lesion involving S3 and S4 with small epidural component concerning for malignancy.  Soft tissue enhancing lesion seen posterior to sacrum on left.  Further evaluation with dedicated MRI of pelvis and sacrum recommended.  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  Severe right neural foraminal narrowing of L4 and L5 and severe neural foraminal narrowing of L5 and S1. Decadron started Neurosurgery was consulted from the ER and recommends admission to [facility] for dedicated MRIs and in person consult    Review of systems:    In addition to the HPI above,  No Fever-chills, No Headache, No changes with Vision or hearing, No problems swallowing food or Liquids, No Chest pain, Cough or Shortness of Breath, No Abdominal pain, No Nausea or Vomiting, bowel movements  are regular, No Blood in stool or Urine, No dysuria, No new skin rashes or bruises, No new joints pains-aches,  No new weakness, tingling, numbness in any extremity, No recent weight gain or loss, No polyuria, polydypsia or polyphagia, No significant Mental Stressors.  All other systems reviewed and are negative.    Past History of the following :    Past Medical History:  Diagnosis Date  . Arthritis       History reviewed. No pertinent surgical history.    Social History:      Social History   Tobacco Use  . Smoking status: Never Smoker  . Smokeless tobacco: Never Used  Substance Use Topics  . Alcohol use: Not Currently       Family History :     Family History  Family history unknown: Yes   ***   Home Medications:   Prior to Admission medications   Medication Sig Start Date End Date Taking? Authorizing Provider  ciprofloxacin (CIPRO) 500 MG tablet Take 1 tablet (500 mg total) by mouth every 12 (twelve)  hours. 11/08/19   Jaynee Eagles, PA-C  tamsulosin (FLOMAX) 0.4 MG CAPS capsule Take 0.8 mg by mouth daily. 10/12/19   [provider]  traMADol (ULTRAM) 50 MG tablet Take 1 tablet (50 mg total) by mouth every 6 (six) hours as needed. 11/08/19   Jaynee Eagles, PA-C     Allergies:    No Known Allergies   Physical Exam:   Vitals  Blood pressure (!) 157/74, pulse 72, temperature 99.4 F (37.4 C), temperature source Oral, resp. rate 18, height 5\' 9"  (1.753 m), weight 117.9 kg, SpO2 96 %.  1.  General:  ***  2. Psychiatric: ***  3. Neurologic: ***  4. HEENMT:  ***  5. Respiratory : ***  6. Cardiovascular : ***  7. Gastrointestinal:  ***  8. Skin:  ***  9.Musculoskeletal:  ***    Data Review:    CBC Recent Labs  Lab 12/13/19 1256  WBC 10.4  HGB 13.1  HCT 42.1  PLT 194  MCV 90.7  MCH 28.2  MCHC 31.1  RDW 13.1   ------------------------------------------------------------------------------------------------------------------  Results for orders placed or performed during the hospital encounter of 12/13/19 (from the past 48 hour(s))  Basic metabolic panel     Status: Abnormal   Collection Time: 12/13/19 12:56 PM  Result Value Ref Range   Sodium 139 135 - 145 mmol/L   Potassium 4.1 3.5 - 5.1 mmol/L   Chloride 102 98 - 111 mmol/L   CO2 25 22 - 32 mmol/L   Glucose, Bld 108 (H) 70 - 99 mg/dL    Comment: Glucose reference range applies only to samples taken after fasting for at least 8 hours.   BUN 13 8 - 23 mg/dL   Creatinine, Ser 1.25 (H) 0.61 - 1.24 mg/dL   Calcium 9.7 8.9 - 10.3 mg/dL   GFR, Estimated >60 >60 mL/min    Comment: (NOTE) Calculated using the CKD-EPI Creatinine Equation (2021)    Anion gap 12 5 - 15    Comment: Performed at [facility], [address], [place], [place] [address]  CBC     Status: None   Collection Time: 12/13/19 12:56 PM  Result Value Ref Range   WBC 10.4 4.0 - 10.5 K/uL   RBC 4.64 4.22 - 5.81 MIL/uL   Hemoglobin  13.1 13.0 - 17.0 g/dL   HCT 42.1 39.0 - 52.0 %   MCV 90.7 80.0 - 100.0 fL  MCH 28.2 26.0 - 34.0 pg   MCHC 31.1 30.0 - 36.0 g/dL   RDW 13.1 11.5 - 15.5 %   Platelets 194 150 - 400 K/uL   nRBC 0.0 0.0 - 0.2 %    Comment: Performed at [facility], [address], [place], [place] [address]  Urinalysis, Routine w reflex microscopic Urine, Clean Catch     Status: None   Collection Time: 12/13/19 12:59 PM  Result Value Ref Range   Color, Urine YELLOW YELLOW   APPearance CLEAR CLEAR   Specific Gravity, Urine 1.012 1.005 - 1.030   pH 5.0 5.0 - 8.0   Glucose, UA NEGATIVE NEGATIVE mg/dL   Hgb urine dipstick NEGATIVE NEGATIVE   Bilirubin Urine NEGATIVE NEGATIVE   Ketones, ur NEGATIVE NEGATIVE mg/dL   Protein, ur NEGATIVE NEGATIVE mg/dL   Nitrite NEGATIVE NEGATIVE   Leukocytes,Ua NEGATIVE NEGATIVE    Comment: Performed at [facility], [address] [place], [place] [address]  Resp Panel by RT-PCR (Flu A&B, Covid) Nasopharyngeal Swab     Status: None   Collection Time: 12/13/19  8:08 PM   Specimen: Nasopharyngeal Swab; Nasopharyngeal(NP) swabs in vial transport medium  Result Value Ref Range   SARS Coronavirus 2 by RT PCR NEGATIVE NEGATIVE    Comment: (NOTE) SARS-CoV-2 target nucleic acids are NOT DETECTED.  The SARS-CoV-2 RNA is generally detectable in upper respiratory specimens during the acute phase of infection. The lowest concentration of SARS-CoV-2 viral copies this assay can detect is 138 copies/mL. A negative result does not preclude SARS-Cov-2 infection and should not be used as the sole basis for treatment or other patient management decisions. A negative result may occur with  improper specimen collection/handling, submission of specimen other than nasopharyngeal swab, presence of viral mutation(s) within the areas targeted by this assay, and inadequate number of viral copies(<138 copies/mL). A negative result must be combined with clinical observations, patient history,  and epidemiological information. The expected result is Negative.  Fact Sheet for Patients:  EntrepreneurPulse.com.au  Fact Sheet for Healthcare Providers:  IncredibleEmployment.be  This test is no t yet approved or cleared by the [place] FDA and  has been authorized for detection and/or diagnosis of SARS-CoV-2 by FDA under an Emergency Use Authorization (EUA). This EUA will remain  in effect (meaning this test can be used) for the duration of the COVID-19 declaration under Section 564(b)(1) of the Act, 21 U.S.C.section 360bbb-3(b)(1), unless the authorization is terminated  or revoked sooner.       Influenza A by PCR NEGATIVE NEGATIVE   Influenza B by PCR NEGATIVE NEGATIVE    Comment: (NOTE) The Xpert Xpress SARS-CoV-2/FLU/RSV plus assay is intended as an aid in the diagnosis of influenza from Nasopharyngeal swab specimens and should not be used as a sole basis for treatment. Nasal washings and aspirates are unacceptable for Xpert Xpress SARS-CoV-2/FLU/RSV testing.  Fact Sheet for Patients: EntrepreneurPulse.com.au  Fact Sheet for Healthcare Providers: IncredibleEmployment.be  This test is not yet approved or cleared by the [place] FDA and has been authorized for detection and/or diagnosis of SARS-CoV-2 by FDA under an Emergency Use Authorization (EUA). This EUA will remain in effect (meaning this test can be used) for the duration of the COVID-19 declaration under Section 564(b)(1) of the Act, 21 U.S.C. section 360bbb-3(b)(1), unless the authorization is terminated or revoked.  Performed at [facility], [address] [place], [place] [address]     Chemistries  Recent Labs  Lab 12/13/19 1256  NA 139  K 4.1  CL 102  CO2 25  GLUCOSE 108*  BUN 13  CREATININE 1.25*  CALCIUM 9.7    ------------------------------------------------------------------------------------------------------------------  ------------------------------------------------------------------------------------------------------------------ GFR: Estimated Creatinine Clearance: 67.7 mL/min (A) (by C-G formula based on SCr of 1.25 mg/dL (H)). Liver Function Tests: No results for input(s): AST, ALT, ALKPHOS, BILITOT, PROT, ALBUMIN in the last 168 hours. No results for input(s): LIPASE, AMYLASE in the last 168 hours. No results for input(s): AMMONIA in the last 168 hours. Coagulation Profile: No results for input(s): INR, PROTIME in the last 168 hours. Cardiac Enzymes: No results for input(s): CKTOTAL, CKMB, CKMBINDEX, TROPONINI in the last 168 hours. BNP (last 3 results) No results for input(s): PROBNP in the last 8760 hours. HbA1C: No results for input(s): HGBA1C in the last 72 hours. CBG: No results for input(s): GLUCAP in the last 168 hours. Lipid Profile: No results for input(s): CHOL, HDL, LDLCALC, TRIG, CHOLHDL, LDLDIRECT in the last 72 hours. Thyroid Function Tests: No results for input(s): TSH, T4TOTAL, FREET4, T3FREE, THYROIDAB in the last 72 hours. Anemia Panel: No results for input(s): VITAMINB12, FOLATE, FERRITIN, TIBC, IRON, RETICCTPCT in the last 72 hours.  --------------------------------------------------------------------------------------------------------------- Urine analysis:    Component Value Date/Time   COLORURINE YELLOW 12/13/2019 1259   APPEARANCEUR CLEAR 12/13/2019 1259   LABSPEC 1.012 12/13/2019 1259   PHURINE 5.0 12/13/2019 1259   GLUCOSEU NEGATIVE 12/13/2019 1259   HGBUR NEGATIVE 12/13/2019 1259   HGBUR negative 07/19/2007 0918   BILIRUBINUR NEGATIVE 12/13/2019 1259   BILIRUBINUR negative 12/13/2019 [place] 12/13/2019 1259   KETONESUR negative 12/13/2019 1115   PROTEINUR NEGATIVE 12/13/2019 1259   PROTEINUR negative 12/13/2019 1115    UROBILINOGEN 0.2 12/13/2019 1115   UROBILINOGEN 1.0 07/19/2007 0918   NITRITE NEGATIVE 12/13/2019 1259   NITRITE Negative 12/13/2019 1115   LEUKOCYTESUR NEGATIVE 12/13/2019 1259   LEUKOCYTESUR Negative 12/13/2019 1115      Imaging Results:    MR Lumbar Spine W Wo Contrast  Result Date: 12/13/2019 CLINICAL DATA:  Low back pain, cauda equina syndrome suspected. EXAM: MRI LUMBAR SPINE WITHOUT AND WITH CONTRAST TECHNIQUE: Multiplanar and multiecho pulse sequences of the lumbar spine were obtained without and with intravenous contrast. CONTRAST:  78mL GADAVIST GADOBUTROL 1 MMOL/ML IV SOLN COMPARISON:  MRI of the lumbar spine October 13, 2008.  In FINDINGS: Segmentation:  Standard. Alignment: Levoscoliosis of the lumbar spine. Grade 1 anterolisthesis of L4 over L5. Vertebrae: Partially visualized on sagittal images is a T1 hypointense, enhancing lesion involving S3 and the visualized S4 with small epidural component, concerning for malignancy. A soft tissue enhancing lesion is seen posterior to the sacrum on the left, also only partially visualized on the sagittal images. No suspicious bone lesion in the lumbar vertebrae. Endplate degenerative changes at L4-5 and L5-S1. Conus medullaris and cauda equina: Conus extends to the L1 level. Conus and cauda equina appear normal. Paraspinal and other soft tissues: Right renal cyst. Disc levels: T12-L1: No spinal canal or neural foraminal stenosis. L1-2: Disc bulge, facet degenerative changes and ligamentum flavum redundancy without significant spinal canal or neural foraminal stenosis. L2-3: Disc bulge, facet degenerative changes and ligamentum flavum redundancy resulting in mild spinal canal stenosis and mild bilateral neural foraminal narrowing. L3-4: Disc bulge with superimposed left subarticular disc protrusion, facet degenerative changes and prominent ligamentum flavum redundancy resulting in severe spinal canal stenosis and mild left neural foraminal  narrowing. L4-5: Right asymmetric disc bulge, facet degenerative changes and prominent ligamentum flavum redundancy resulting in moderate spinal canal  stenosis, severe right and mild left neural foraminal narrowing. L5-S1: Disc bulge with superimposed osteophytic component, facet degenerative changes and ligamentum flavum redundancy resulting in narrowing of the bilateral subarticular zones, left greater than mild right and severe left neural foraminal narrowing. IMPRESSION: 1. Partially visualized T1 hypointense, enhancing lesion involving the S3 and visualized S4 with small epidural component, concerning for malignancy. A soft tissue enhancing lesion is seen posterior to the sacrum on the left, also only partially visualized on the sagittal images. 2. Further evaluation with dedicated MRI of the pelvis/sacrum is recommended. 3. Multilevel degenerative changes of the lumbar spine with severe spinal canal stenosis at L3-4 and moderate spinal canal stenosis at L4-5. 4. Severe right neural foraminal narrowing at L4-5 and severe left neural foraminal narrowing at L5-S1. These results will be called to the ordering clinician or representative by the Radiologist Assistant, and communication documented in the PACS or [employer]. Electronically Signed   By: Pedro Earls M.D.   On: 12/13/2019 19:10    My personal review of EKG: Rhythm NSR, Rate ** /min, QTc *** ,no Acute ST changes   Assessment & Plan:    Active Problems:   Low back pain   1. Low back pain 1. Back pain likely musculoskeletal as patient helped push a truck the same day the back pain started 2. He did have associated saddle anesthesia per his report, and urinary incontinence 3. MRI shows abnormal findings and recommends dedicated MRI of pelvis and sacrum 4. There is also concern for malignancy - consider onco consult 5. Pain scale  6. Continue decadron until cauda equina syndrome has been ruled out 7. Neurosurg  consulted from ED and recommends admission at [facility] 2. Urinary incontinence 1. Likely overflow incontinence as patient has known outlet obstruction and had a bladder scan >1L, as wel 3. Urinary retention 1. Bladder scan > 1L 2. Foley catheter placed with 1.5L out 3. Continue to monitor I and O 4. Cauda Equina workup as above 4. High blood pressure without diagnosis of hypertension 1. BP 157/74 in the ER 2. This is in the setting of acute pain 3. Consider adding p.o. agent if blood pressure remains elevated with adequate pain control 4. Strongly urged patient to establish with PCP    DVT Prophylaxis-   Heparin - SCDs  AM Labs Ordered, also please review Full Orders  Family Communication: Admission, patients condition and plan of care including tests being ordered have been discussed with the patient and "caretaker" who indicate understanding and agree with the plan and Code Status.  Code Status:  Full  Admission status: Inpatient :The appropriate admission status for this patient is INPATIENT. Inpatient status is judged to be reasonable and necessary in order to provide the required intensity of service to ensure the patient's safety. The patient's presenting symptoms, physical exam findings, and initial radiographic and laboratory data in the context of their chronic comorbidities is felt to place them at high risk for further clinical deterioration. Furthermore, it is not anticipated that the patient will be medically stable for discharge from the hospital within 2 midnights of admission. The following factors support the admission status of inpatient.     The patient's presenting symptoms include low back pain The worrisome physical exam findings include Urinary retention with 1.5 L out The initial radiographic and laboratory data are worrisome because of abnormal MRI of lumbar spine and sacrum The chronic co-morbidities include does not follow with PCP, likely has undiagnosed chronic  problems in addition to arthritis       * I certify that at the point of admission it is my clinical judgment that the patient will require inpatient hospital care spanning beyond 2 midnights from the point of admission due to high intensity of service, high risk for further deterioration and high frequency of surveillance required.*  Time spent in minutes : FULL   Jyssica Rief B Zierle-Ghosh DO

## 2019-12-13 NOTE — ED Provider Notes (Signed)
[facility]    CSN: [id number] Arrival date & time: 12/13/19  1026      History   Chief Complaint Chief Complaint  Patient presents with  . Abdominal Pain    HPI Johnny Fischer is a 74 y.o. male.   HPI Patient presents today with a complaint of urine incontinence which is continuous as of yesterday evening.  He endorses constant loss of regulation of bladder.  He was seen at the ER in [facility] several months ago diagnosed with neurogenic bladder and prescribed a chronic catheter and was advised to follow-up with urology however has not done so and remove the catheter on his own.  He was seen here a little over a month ago and treated for a UTI.  Today he endorses numbness lower portion of his back and buttocks and is having continuous leakage of his bladder.  He denies leakage of stool but endorses being in severe pain.  He does not have a primary care provider and has not been seen by any other specialty outside of the emergency department or urgent care in several years.  His blood pressure is severely elevated on arrival today. Past Medical History:  Diagnosis Date  . Arthritis     Patient Active Problem List   Diagnosis Date Noted  . SPINAL STENOSIS 10/21/2008  . DEGENERATIVE DISC DISEASE, LUMBOSACRAL SPINE W/RADICULOPATHY 09/04/2008  . ERECTILE DYSFUNCTION 05/14/2008  . HYPERGLYCEMIA, FASTING 11/14/2007  . OTHER NONSPECIFIC FINDINGS EXAMINATION OF BLOOD 11/14/2007  . KNEE PAIN, LEFT 11/24/2006  . WRIST PAIN 11/10/2006  . GERD, SEVERE 06/15/2006  . HYPERLIPIDEMIA 12/07/2005  . OBESITY NOS 12/07/2005  . HYPERTENSION 12/07/2005  . HERNIA, UMBILICAL 70/01/7492  . CALCULUS, KIDNEY 12/07/2005  . OSTEOARTHRITIS 12/07/2005  . DVT, HX OF 12/07/2005    History reviewed. No pertinent surgical history.     Home Medications    Prior to Admission medications   Medication Sig Start Date End Date Taking? Authorizing Provider  ciprofloxacin (CIPRO)  500 MG tablet Take 1 tablet (500 mg total) by mouth every 12 (twelve) hours. 11/08/19   Jaynee Eagles, PA-C  tamsulosin (FLOMAX) 0.4 MG CAPS capsule Take 0.8 mg by mouth daily. 10/12/19   [provider]  traMADol (ULTRAM) 50 MG tablet Take 1 tablet (50 mg total) by mouth every 6 (six) hours as needed. 11/08/19   Jaynee Eagles, PA-C    Family History Family History  Family history unknown: Yes    Social History Social History   Tobacco Use  . Smoking status: Never Smoker  . Smokeless tobacco: Never Used  Substance Use Topics  . Alcohol use: Not Currently  . Drug use: Never     Allergies   Patient has no known allergies.   Review of Systems Review of Systems Pertinent negatives listed in HPI   Physical Exam Triage Vital Signs ED Triage Vitals  Enc Vitals Group     BP 12/13/19 1105 (!) 198/82     Pulse Rate 12/13/19 1105 69     Resp 12/13/19 1105 18     Temp 12/13/19 1105 98.3 F (36.8 C)     Temp Source 12/13/19 1105 Oral     SpO2 12/13/19 1105 97 %     Weight 12/13/19 1101 260 lb (117.9 kg)     Height 12/13/19 1101 5\' 9"  (1.753 m)     Head Circumference --      Peak Flow --      Pain Score 12/13/19 1100  10     Pain Loc --      Pain Edu? --      Excl. in [place]? --    No data found.  Updated Vital Signs BP (!) 198/82 (BP Location: Right Arm)   Pulse 69   Temp 98.3 F (36.8 C) (Oral)   Resp 18   Ht 5\' 9"  (1.753 m)   Wt 260 lb (117.9 kg)   SpO2 97%   BMI 38.40 kg/m   Visual Acuity Right Eye Distance:   Left Eye Distance:   Bilateral Distance:    Right Eye Near:   Left Eye Near:    Bilateral Near:     Physical Exam Constitutional:      Appearance: He is obese.     Comments: Chronically ill appearing   Cardiovascular:     Rate and Rhythm: Normal rate and regular rhythm.  Pulmonary:     Effort: Pulmonary effort is normal.  Abdominal:     General: There is distension.     Tenderness: There is abdominal tenderness in the right lower quadrant  and left lower quadrant.  Neurological:     Mental Status: He is alert.     Comments: Unable to unable to elicit reflexes patient level of discomfort and inability to move without expressing urine   Psychiatric:        Mood and Affect: Mood normal.        Behavior: Behavior normal.      UC Treatments / Results  Labs (all labs ordered are listed, but only abnormal results are displayed) Labs Reviewed  POCT URINALYSIS DIP (MANUAL ENTRY) - Abnormal; Notable for the following components:      Result Value   Blood, UA trace-intact (*)    All other components within normal limits  URINE CULTURE    EKG   Radiology No results found.  Procedures Procedures (including critical care time)  Medications Ordered in UC Medications - No data to display  Initial Impression / Assessment and Plan / UC Course  I have reviewed the triage vital signs and the nursing notes.  Pertinent labs & imaging results that were available during my care of the patient were reviewed by me and considered in my medical decision making (see chart for details).     Patient has classic symptoms consistent with that of a cauda equina syndrome with neurogenic bladder features.  Patient warrants higher level care and evaluation outside of the setting of the ER given this could be an indication of severe neurological dysfunction related to chronic spinal disease.  Patient is accompanied by family patient given check pads to collect the urine.  Patient has a antalgic gait and endorses severity of pain and numbness.  Blood pressure is also severely elevated however he is stable and mentating appropriately for a transport via personal automobile by family.  Patient endorses that he will go immediately to the ER for further work-up and evaluation.  Patient does not have a primary care provider and has not been seen recently by urology. 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     ED Prescriptions    None     PDMP not reviewed this encounter.   Scot Jun, FNP 12/13/19 1205

## 2019-12-13 NOTE — Discharge Instructions (Addendum)
If you insist on leaving [place], I highly encourage you to seek emergent outpatient follow-up with primary care, urology, and even oncology.  You need to have further imaging done and even a biopsy.  You are welcome to come back to the ER and we will gladly admit you for further work-up if you so choose.  Your findings here in the ER were highly concerning.  You have chosen to leave [place]. Should you change your mind, you are always welcome and encouraged to return to the ED. You are encouraged to follow-up with, at the very least, a primary care provider, or other similar medical professional on this matter.

## 2019-12-13 NOTE — Discharge Instructions (Addendum)
Given your concerning neurological symptoms of numbness in your lower back and your inability to hold your urine I would like for you to be evaluated in the [facility] emergency department to rule out any neurological injury causing your inability to retain your urine.

## 2019-12-13 NOTE — ED Provider Notes (Addendum)
[facility] EMERGENCY DEPARTMENT Provider Note   CSN: [id number] Arrival date & time: 12/13/19  1207     History No chief complaint on file.   Johnny Fischer is a 74 y.o. male with PMH significant for lumbar spinal stenosis, DDD with radiculopathy, and history of BPH and urinary retention who had been referred to [facility] who presents to the ED sent from urgent care with concern for cauda equina syndrome.    On my examination, patient states that at 7 PM last evening he noticed dribbling of urine from his penis. He states it has been constant. He states that he has not had a full void ever since because he has not felt like he has needed to do to the incessant dribbling. He is also complaining of 10 out of 10 lower back pain.  Patient also reported that last evening he was having paresthesias involving his groin, but states that they have since improved.  He had a normal bowel movement yesterday.  He denies any recent illness or infection, fevers or chills, abdominal pain, or other symptoms.  I asked patient if this compares to his prior encounters for low back pain and he states that it feels much more severe and this is the first time that he has experienced paresthesias in his groin.  He denies any ambulatory dysfunction or weakness in his legs, but states that he was unable to sleep last night due to his significant pain symptoms.  He denies any history of IVDA or malignancy.  He has a prescription for tamsulosin which he states he has been taking, as directed.  HPI     Past Medical History:  Diagnosis Date  . Arthritis     Patient Active Problem List   Diagnosis Date Noted  . Low back pain 12/13/2019  . SPINAL STENOSIS 10/21/2008  . DEGENERATIVE DISC DISEASE, LUMBOSACRAL SPINE W/RADICULOPATHY 09/04/2008  . ERECTILE DYSFUNCTION 05/14/2008  . HYPERGLYCEMIA, FASTING 11/14/2007  . OTHER NONSPECIFIC FINDINGS EXAMINATION OF BLOOD 11/14/2007  . KNEE PAIN, LEFT 11/24/2006   . WRIST PAIN 11/10/2006  . GERD, SEVERE 06/15/2006  . HYPERLIPIDEMIA 12/07/2005  . OBESITY NOS 12/07/2005  . HYPERTENSION 12/07/2005  . HERNIA, UMBILICAL 41/42/3953  . CALCULUS, KIDNEY 12/07/2005  . OSTEOARTHRITIS 12/07/2005  . DVT, HX OF 12/07/2005    History reviewed. No pertinent surgical history.     Family History  Family history unknown: Yes    Social History   Tobacco Use  . Smoking status: Never Smoker  . Smokeless tobacco: Never Used  Substance Use Topics  . Alcohol use: Not Currently  . Drug use: Never    Home Medications Prior to Admission medications   Medication Sig Start Date End Date Taking? Authorizing Provider  cephALEXin (KEFLEX) 500 MG capsule Take 500 mg by mouth 2 (two) times daily. 10/07/19   [provider]  ciprofloxacin (CIPRO) 500 MG tablet Take 1 tablet (500 mg total) by mouth every 12 (twelve) hours. 11/08/19   Jaynee Eagles, PA-C  tamsulosin (FLOMAX) 0.4 MG CAPS capsule Take 0.8 mg by mouth daily. 10/12/19   [provider]  traMADol (ULTRAM) 50 MG tablet Take 1 tablet (50 mg total) by mouth every 6 (six) hours as needed. 11/08/19   Jaynee Eagles, PA-C    Allergies    Patient has no known allergies.  Review of Systems   Review of Systems  All other systems reviewed and are negative.   Physical Exam Updated Vital Signs BP (!) 157/74 (  BP Location: Left Arm)   Pulse 72   Temp 99.4 F (37.4 C) (Oral)   Resp 18   Ht 5\' 9"  (1.753 m)   Wt 117.9 kg   SpO2 96%   BMI 38.40 kg/m   Physical Exam Vitals and nursing note reviewed. Exam conducted with a chaperone present.  Constitutional:      General: He is not in acute distress.    Comments: Uncomfortable.  HENT:     Head: Normocephalic and atraumatic.  Eyes:     General: No scleral icterus.    Conjunctiva/sclera: Conjunctivae normal.  Cardiovascular:     Rate and Rhythm: Normal rate and regular rhythm.     Pulses: Normal pulses.  Pulmonary:     Effort: Pulmonary  effort is normal.  Abdominal:     General: Abdomen is flat. There is no distension.     Palpations: Abdomen is soft.     Tenderness: There is no abdominal tenderness. There is no guarding.     Comments: No significant suprapubic abdominal distention or tenderness on exam.  Genitourinary:    Comments: No diminished sensation to light touch in genital region.  Rectal exam: Diminished sphincter tone.  Large prostate appreciated on exam. Musculoskeletal:     Cervical back: Normal range of motion. No rigidity.     Right lower leg: Edema present.     Left lower leg: Edema present.  Skin:    General: Skin is dry.     Capillary Refill: Capillary refill takes less than 2 seconds.  Neurological:     General: No focal deficit present.     Mental Status: He is alert and oriented to person, place, and time.     GCS: GCS eye subscore is 4. GCS verbal subscore is 5. GCS motor subscore is 6.     Cranial Nerves: No cranial nerve deficit.     Sensory: No sensory deficit.     Coordination: Coordination normal.     Comments: Antalgic gait.  Psychiatric:        Mood and Affect: Mood normal.        Behavior: Behavior normal.        Thought Content: Thought content normal.     ED Results / Procedures / Treatments   Labs (all labs ordered are listed, but only abnormal results are displayed) Labs Reviewed  BASIC METABOLIC PANEL - Abnormal; Notable for the following components:      Result Value   Glucose, Bld 108 (*)    Creatinine, Ser 1.25 (*)    All other components within normal limits  RESP PANEL BY RT-PCR (FLU A&B, COVID) ARPGX2  URINALYSIS, ROUTINE W REFLEX MICROSCOPIC  CBC  ACETAMINOPHEN LEVEL    EKG None  Radiology MR Lumbar Spine W Wo Contrast  Result Date: 12/13/2019 CLINICAL DATA:  Low back pain, cauda equina syndrome suspected. EXAM: MRI LUMBAR SPINE WITHOUT AND WITH CONTRAST TECHNIQUE: Multiplanar and multiecho pulse sequences of the lumbar spine were obtained without and with  intravenous contrast. CONTRAST:  17mL GADAVIST GADOBUTROL 1 MMOL/ML IV SOLN COMPARISON:  MRI of the lumbar spine October 13, 2008.  In FINDINGS: Segmentation:  Standard. Alignment: Levoscoliosis of the lumbar spine. Grade 1 anterolisthesis of L4 over L5. Vertebrae: Partially visualized on sagittal images is a T1 hypointense, enhancing lesion involving S3 and the visualized S4 with small epidural component, concerning for malignancy. A soft tissue enhancing lesion is seen posterior to the sacrum on the left, also only partially visualized on  the sagittal images. No suspicious bone lesion in the lumbar vertebrae. Endplate degenerative changes at L4-5 and L5-S1. Conus medullaris and cauda equina: Conus extends to the L1 level. Conus and cauda equina appear normal. Paraspinal and other soft tissues: Right renal cyst. Disc levels: T12-L1: No spinal canal or neural foraminal stenosis. L1-2: Disc bulge, facet degenerative changes and ligamentum flavum redundancy without significant spinal canal or neural foraminal stenosis. L2-3: Disc bulge, facet degenerative changes and ligamentum flavum redundancy resulting in mild spinal canal stenosis and mild bilateral neural foraminal narrowing. L3-4: Disc bulge with superimposed left subarticular disc protrusion, facet degenerative changes and prominent ligamentum flavum redundancy resulting in severe spinal canal stenosis and mild left neural foraminal narrowing. L4-5: Right asymmetric disc bulge, facet degenerative changes and prominent ligamentum flavum redundancy resulting in moderate spinal canal stenosis, severe right and mild left neural foraminal narrowing. L5-S1: Disc bulge with superimposed osteophytic component, facet degenerative changes and ligamentum flavum redundancy resulting in narrowing of the bilateral subarticular zones, left greater than mild right and severe left neural foraminal narrowing. IMPRESSION: 1. Partially visualized T1 hypointense, enhancing lesion  involving the S3 and visualized S4 with small epidural component, concerning for malignancy. A soft tissue enhancing lesion is seen posterior to the sacrum on the left, also only partially visualized on the sagittal images. 2. Further evaluation with dedicated MRI of the pelvis/sacrum is recommended. 3. Multilevel degenerative changes of the lumbar spine with severe spinal canal stenosis at L3-4 and moderate spinal canal stenosis at L4-5. 4. Severe right neural foraminal narrowing at L4-5 and severe left neural foraminal narrowing at L5-S1. These results will be called to the ordering clinician or representative by the Radiologist Assistant, and communication documented in the PACS or [employer]. Electronically Signed   By: Pedro Earls M.D.   On: 12/13/2019 19:10    Procedures Procedures (including critical care time)  Medications Ordered in ED Medications  dexamethasone (DECADRON) injection 12 mg (12 mg Intravenous Given 12/13/19 1827)  gadobutrol (GADAVIST) 1 MMOL/ML injection 10 mL (10 mLs Intravenous Contrast Given 12/13/19 1734)    ED Course  I have reviewed the triage vital signs and the nursing notes.  Pertinent labs & imaging results that were available during my care of the patient were reviewed by me and considered in my medical decision making (see chart for details).  Clinical Course as of 12/13/19 2201  Fri Dec 13, 2019  2005 Spoke with Dr. Venetia Constable, [facility].  He reviewed the MRI and has higher suspicion for a prostate cancer, but cannot exclude S4 involvement as possible contributing factor.  He would prefer that patient be admitted to [facility] and he will round on them there.  Plan for dedicated MRI in the morning.  Will consult hospitalist here at [facility] for admission and transfer. [GG]  2023 Spoke with Dr. Clearence Ped, hospitalist, who will admit patient to [facility]. [GG]    Clinical Course User Index [GG] Corena Herter, PA-C    MDM Rules/Calculators/A&P                          Patient did have an impressively large prostate on my exam, if work-up is negative, suspect BPH and urinary outlet obstruction as cause for his symptoms.  Will encourage him to continue taking tamsulosin and follow-up with [facility].  Will place Foley catheter.  However, given concern for cord compression or cauda equina, will administer 12 mg Decadron  and obtain bladder scan and MRI with and without lumbar spine.    Bladder scan revealing greater than 1 L retained urine.  Surprised patient is not complaining of any suprapubic pain or tenderness on my exam, but this is likely reflective of a subacute or chronic retention picture.  He needs to be followed closely by urology for ongoing management.  Emphasized the importance of follow-up with [facility], as referred previously.  MRI with and without contrast of lumbar spine is personally reviewed and demonstrates a partially visualized T1 hypointense enhancing lesion involving the S3 and visualized S4 with small epidural component, concerning for emergency.  There is also soft tissue enhancing lesion seen posterior to the sacrum on the left, also only partially visualized.  Recommend further evaluation with dedicated MRI of the pelvis/sacrum.  There is also severe spinal canal stenosis at L3-L4 and moderate spinal canal stenosis at L4-L5.  Severe right foraminal narrowing at L4-L5 and left at L5-S1.  Will consult neurosurgery to see if they would recommend ED-to-ED transfer in order for dedicated MRI and further management.  Spoke with Dr. Venetia Constable, [facility].  He reviewed the MRI and has higher suspicion for a prostate cancer, but cannot exclude S4 involvement as possible contributing factor.  He would prefer that patient be admitted to [facility] and he will round on them there.  Plan for dedicated MRI in the morning.  Will consult hospitalist here at [facility] for  admission and transfer.    Spoke with Dr. Clearence Ped, hospitalist, who will admit patient to [facility].  Dr. Clearence Ped informs me that patient is now refusing admission to [facility].  He states that he feels improved and wants to go home.  He states that his daughter works in the hospital and will help facilitate continued work-up.  I strongly emphasized the importance of admission given his lack of outpatient follow-up.  I told him that this is an emergent condition and that he is severely jeopardizing his health by electing to go home.  His caretaker/friend is now at bedside and is also supporting his decision to leave.  He is cognitively intact and is capable of making his own decisions.  I cautioned him about my concern for malignancy or cord compression, among others.  Regardless, he insists on going home.  Patient wishes to leave [place]. I personally explained need for further testing and my concerns for adverse outcomes if workup is incomplete. Specific concerns explained to patient include worsening symptoms, functional loss, long term sickness and death. Patient states understanding of risks and states they will return if they feel the need to at a later date to receive the recommended care or any other care at any time, regardless of their ability to pay for such care. Patient understands they are able to return at any time. Patient is able to explain back the risks of leaving AMA and still wishes to leave.   Specifically I recommend neurosurgical evaluation, dedicated MRI, and biopsy of lesion concerning for malignancy.    The patient is oriented to person, place, and time, has the capacity to make decisions regarding the medical care offered. The patient speaks coherently and exhibits no evidence of having an altered level of consciousness or alcohol or drug intoxication to a point that would impair judgment. They respond knowingly to questions about recommended treatment  and alternate treatments including no further testing or treatment; participate in diagnostic and treatment decisions by means of rational thought processes;  and understand the items of minimum basic medical treatment information with respect to that treatment (the nature and seriousness of the illness, the nature of the treatment, the probable degree and duration of any benefits and risks of any medical intervention that is being recommended, and the consequences of lack of treatment, and the nature, risks, and benefits of any reasonable alternatives).  The patient understands the relevant information of the nature of their medical condition, as well as the risks, benefits, and treatment alternatives (including non-treatment), consequences of refusing care, and can competently communicate a rational explanation about their choice of care options.    Included in AVS was the following message:  You have chosen to leave [place]. Should you change your mind, you are always welcome and encouraged to return to the ED. You are encouraged to follow-up with, at the very least, a primary care provider, or other similar medical professional on this matter.   Final Clinical Impression(s) / ED Diagnoses Final diagnoses:  Urinary retention  Acute midline low back pain, unspecified whether sciatica present    Rx / DC Orders ED Discharge Orders    None       Corena Herter, PA-C 12/13/19 2024    Corena Herter, PA-C 12/13/19 2201    Sherwood Gambler, MD 12/14/19 1754

## 2019-12-15 LAB — URINE CULTURE
Culture: <10000 — AB
Special Requests: NORMAL

## 2020-01-24 ENCOUNTER — Other Ambulatory Visit: Payer: Self-pay

## 2020-01-24 ENCOUNTER — Ambulatory Visit (INDEPENDENT_AMBULATORY_CARE_PROVIDER_SITE_OTHER): Payer: Medicare HMO | Admitting: Urology

## 2020-01-24 ENCOUNTER — Encounter: Payer: Self-pay | Admitting: Urology

## 2020-01-24 VITALS — BP 170/81 | HR 66 | Temp 98.0°F | Ht 69.0 in | Wt 260.0 lb

## 2020-01-24 DIAGNOSIS — R339 Retention of urine, unspecified: Secondary | ICD-10-CM | POA: Diagnosis not present

## 2020-01-24 NOTE — Progress Notes (Signed)
01/24/2020 10:43 AM   Charlotte Crumb [birth year] [id number]  Referring provider: No referring provider defined for this encounter.  Urinary retention  HPI: Mr Ludtke is a 75yo here for evaluation of urinary retention. He was diagnosed with Urinary retention on 10/07/2019. He was started on flomax 0.8mg  but has never had a voiding trial. He has urge to urinate. He was having severe LUTS for 6 months prior to the foley being placed. He is requesting the foley be removed today   PMH: Past Medical History:  Diagnosis Date  . Arthritis     Surgical History: History reviewed. No pertinent surgical history.  Home Medications:  Allergies as of 01/24/2020   No Known Allergies     Medication List       Accurate as of January 24, 2020 10:43 AM. If you have any questions, ask your nurse or doctor.        cephALEXin 500 MG capsule Commonly known as: KEFLEX Take 500 mg by mouth 2 (two) times daily.   ciprofloxacin 500 MG tablet Commonly known as: CIPRO Take 1 tablet (500 mg total) by mouth every 12 (twelve) hours.   tamsulosin 0.4 MG Caps capsule Commonly known as: FLOMAX Take 0.8 mg by mouth daily.   traMADol 50 MG tablet Commonly known as: ULTRAM Take 1 tablet (50 mg total) by mouth every 6 (six) hours as needed.       Allergies: No Known Allergies  Family History: Family History  Family history unknown: Yes    Social History:  reports that he has never smoked. He has never used smokeless tobacco. He reports previous alcohol use. He reports that he does not use drugs.  ROS: All other review of systems were reviewed and are negative except what is noted above in HPI  Physical Exam: BP (!) 170/81   Pulse 66   Temp 98 F (36.7 C)   Ht 5\' 9"  (1.753 m)   Wt 260 lb (117.9 kg)   BMI 38.40 kg/m   Constitutional:  Alert and oriented, No acute distress. HEENT: [place] AT, moist mucus membranes.  Trachea midline, no masses. Cardiovascular: No clubbing, cyanosis,  or edema. Respiratory: Normal respiratory effort, no increased work of breathing. GI: Abdomen is soft, nontender, nondistended, no abdominal masses GU: No CVA tenderness.  Lymph: No cervical or inguinal lymphadenopathy. Skin: No rashes, bruises or suspicious lesions. Neurologic: Grossly intact, no focal deficits, moving all 4 extremities. Psychiatric: Normal mood and affect.  Laboratory Data: Lab Results  Component Value Date   WBC 10.4 12/13/2019   HGB 13.1 12/13/2019   HCT 42.1 12/13/2019   MCV 90.7 12/13/2019   PLT 194 12/13/2019    Lab Results  Component Value Date   CREATININE 1.25 (H) 12/13/2019    Lab Results  Component Value Date   PSA 2.22 10/18/2007   PSA 1.51 05/08/2006   PSA NORMAL 03/16/2005    No results found for: TESTOSTERONE  No results found for: HGBA1C  Urinalysis    Component Value Date/Time   COLORURINE YELLOW 12/13/2019 [place] 12/13/2019 1259   LABSPEC 1.012 12/13/2019 [place] 5.0 12/13/2019 1259   GLUCOSEU NEGATIVE 12/13/2019 [place] 12/13/2019 1259   HGBUR negative 07/19/2007 [place] 12/13/2019 1259   BILIRUBINUR negative 12/13/2019 [place] 12/13/2019 1259   KETONESUR negative 12/13/2019 [place] 12/13/2019 1259   PROTEINUR negative 12/13/2019 1115   UROBILINOGEN  0.2 12/13/2019 1115   UROBILINOGEN 1.0 07/19/2007 0918   NITRITE NEGATIVE 12/13/2019 1259   NITRITE Negative 12/13/2019 1115   LEUKOCYTESUR NEGATIVE 12/13/2019 1259   LEUKOCYTESUR Negative 12/13/2019 1115    No results found for: LABMICR, WBCUA, RBCUA, LABEPIT, MUCUS, BACTERIA  Pertinent Imaging:  No results found for this or any previous visit.  No results found for this or any previous visit.  No results found for this or any previous visit.  No results found for this or any previous visit.  No results found for this or any previous visit.  No results found for this  or any previous visit.  No results found for this or any previous visit.  No results found for this or any previous visit.   Assessment & Plan:    1. Urinary retention -Schedule for UDS due to concern neurogenic bladder.  Voiding trial failed today   No follow-ups on file.  Nicolette Bang, MD  [facility] [place]

## 2020-01-24 NOTE — Progress Notes (Signed)
Simple Catheter Placement  Due to urinary retention patient is present today for a foley cath placement.  Patient was cleaned and prepped in a sterile fashion with betadine. A 16 FR foley catheter was inserted, urine return was noted  273ml, urine was yellow in color.  The balloon was filled with 10cc of sterile water.  A leg bag was attached for drainage. Patient was also given a night bag to take home and was given instruction on how to change from one bag to another.  Patient was given instruction on proper catheter care.  Patient tolerated well, no complications were noted   Performed by: Katharin Schneider,lpn  Additional notes/ Follow up: Per MD note

## 2020-01-24 NOTE — Progress Notes (Signed)

## 2020-01-24 NOTE — Patient Instructions (Signed)
Acute Urinary Retention, Male  Acute urinary retention is when a person cannot pee (urinate) at all, or can only pee a little. This can come on all of a sudden. If it is not treated, it can lead to kidney problems or other serious problems. What are the causes?  A problem with the tube that drains the bladder (urethra).  Problems with the nerves in the bladder.  Tumors.  Certain medicines.  An infection.  Having trouble pooping (constipation). What increases the risk? Older men are more at risk because their prostate gland may become larger as they age. Other conditions also can increase risk. These include:  Diseases, such as multiple sclerosis.  Injury to the spinal cord.  Diabetes.  A condition that affects the way the brain works, such as dementia.  Holding back urine due to trauma or because you do not want to use the bathroom. What are the signs or symptoms?  Trouble peeing.  Pain in the lower belly. How is this treated? Treatment for this condition may include:  Medicines.  Placing a thin, germ-free tube (catheter) into the bladder to drain pee out of the body.  Therapy to treat mental health conditions.  Treatment for conditions that may cause this. If needed, you may be treated in the hospital for kidney problems or to manage other problems. Follow these instructions at home: Medicines  Take over-the-counter and prescription medicines only as told by your doctor. Ask your doctor what medicines you should stay away from.  If you were given an antibiotic medicine, take it as told by your doctor. Do not stop taking it, even if you start to feel better. General instructions  Do not smoke or use any products that contain nicotine or tobacco. If you need help quitting, ask your doctor.  Drink enough fluid to keep your pee pale yellow.  If you were sent home with a tube that drains the bladder, take care of it as told by your doctor.  Watch for changes in  your symptoms. Tell your doctor about them.  If told, keep track of changes in your blood pressure at home. Tell your doctor about them.  Keep all follow-up visits. Contact a doctor if:  You have spasms in your bladder that you cannot stop.  You leak pee when you have spasms. Get help right away if:  You have chills or a fever.  You have blood in your pee.  You have a tube that drains pee from the bladder and these things happen: ? The tube stops draining pee. ? The tube falls out. Summary  Acute urinary retention is when you cannot pee at all or you pee too little.  If this condition is not treated, it can lead to kidney problems or other serious problems.  If you were sent home with a tube (catheter) that drains the bladder, take care of it as told by your doctor.  Watch for changes in your symptoms. Tell your doctor about them. This information is not intended to replace advice given to you by your health care provider. Make sure you discuss any questions you have with your health care provider. Document Revised: 09/11/2019 Document Reviewed: 09/11/2019 Elsevier Patient Education  2021 Elsevier Inc.  

## 2020-01-24 NOTE — Progress Notes (Signed)
Fill and Pull Catheter Removal  Patient is present today for a catheter removal.  Patient was cleaned and prepped in a sterile fashion 139ml of sterile water/ saline was instilled into the bladder when the patient felt the urge to urinate. 66ml of water was then drained from the balloon.  A 16FR foley cath was removed from the bladder  .  Patient as then given some time to void on their own.  Patient cannot void  on their own after some time. Pt is to come back this afternoon per Dr. Alyson Ingles for a PVR. Patient tolerated well.  Performed by: Valentina Lucks.LPN

## 2020-02-19 ENCOUNTER — Encounter: Payer: Self-pay | Admitting: Internal Medicine

## 2020-03-18 ENCOUNTER — Other Ambulatory Visit: Payer: Self-pay

## 2020-03-18 ENCOUNTER — Ambulatory Visit (INDEPENDENT_AMBULATORY_CARE_PROVIDER_SITE_OTHER): Payer: Medicare HMO | Admitting: Urology

## 2020-03-18 ENCOUNTER — Encounter: Payer: Self-pay | Admitting: Urology

## 2020-03-18 VITALS — BP 159/76 | HR 61 | Temp 98.6°F | Ht 69.0 in | Wt 260.0 lb

## 2020-03-18 DIAGNOSIS — N401 Enlarged prostate with lower urinary tract symptoms: Secondary | ICD-10-CM

## 2020-03-18 DIAGNOSIS — N138 Other obstructive and reflux uropathy: Secondary | ICD-10-CM

## 2020-03-18 DIAGNOSIS — R339 Retention of urine, unspecified: Secondary | ICD-10-CM

## 2020-03-18 LAB — MICROSCOPIC EXAMINATION
Renal Epithel, UA: NONE SEEN /hpf
WBC, UA: >30 /hpf — AB (ref 0–5)

## 2020-03-18 LAB — URINALYSIS, ROUTINE W REFLEX MICROSCOPIC
Bilirubin, UA: NEGATIVE
Glucose, UA: NEGATIVE
Ketones, UA: NEGATIVE
Nitrite, UA: NEGATIVE
Specific Gravity, UA: 1.02 (ref 1.005–1.030)
Urobilinogen, Ur: 0.2 mg/dL (ref 0.2–1.0)
pH, UA: 6.5 (ref 5.0–7.5)

## 2020-03-18 MED ORDER — SILODOSIN 8 MG PO CAPS: 8.0000 mg | ORAL_CAPSULE | Freq: Every day | ORAL | 11 refills | Status: DC

## 2020-03-18 NOTE — Patient Instructions (Signed)

## 2020-03-18 NOTE — Progress Notes (Signed)
03/18/2020 1:50 PM   Johnny Fischer June 11, 1945 [id number]  Referring provider: No referring provider defined for this encounter.  Urinary retention  HPI: Johnny Fischer is a 75yo here for followup for BPH and urinary retention. He foley catheter stopped draining yesterday and he presented today with severe abdominal pain. His foley was changed and 500cc of urine drained. He underwent urodynamics 01/29/2020 which showed a 500cc bladder capacity and 41-44cm H2O detrusor pressure. He has failed multiple voiding trials and multiple alpha-blockers.    PMH: Past Medical History:  Diagnosis Date  . Arthritis     Surgical History: History reviewed. No pertinent surgical history.  Home Medications:  Allergies as of 03/18/2020   No Known Allergies     Medication List       Accurate as of March 18, 2020  1:50 PM. If you have any questions, ask your nurse or doctor.        cephALEXin 500 MG capsule Commonly known as: KEFLEX Take 500 mg by mouth 2 (two) times daily.   ciprofloxacin 500 MG tablet Commonly known as: CIPRO Take 1 tablet (500 mg total) by mouth every 12 (twelve) hours.   silodosin 8 MG Caps capsule Commonly known as: RAPAFLO Take 1 capsule (8 mg total) by mouth at bedtime. Started by: Nicolette Bang, MD   tamsulosin 0.4 MG Caps capsule Commonly known as: FLOMAX Take 0.8 mg by mouth daily.   traMADol 50 MG tablet Commonly known as: ULTRAM Take 1 tablet (50 mg total) by mouth every 6 (six) hours as needed.       Allergies: No Known Allergies  Family History: Family History  Family history unknown: Yes    Social History:  reports that he has never smoked. He has never used smokeless tobacco. He reports previous alcohol use. He reports that he does not use drugs.  ROS: All other review of systems were reviewed and are negative except what is noted above in HPI  Physical Exam: There were no vitals taken for this visit.  Constitutional:  Alert and  oriented, No acute distress. HEENT: [place] AT, moist mucus membranes.  Trachea midline, no masses. Cardiovascular: No clubbing, cyanosis, or edema. Respiratory: Normal respiratory effort, no increased work of breathing. GI: Abdomen is soft, nontender, nondistended, no abdominal masses GU: No CVA tenderness.  Lymph: No cervical or inguinal lymphadenopathy. Skin: No rashes, bruises or suspicious lesions. Neurologic: Grossly intact, no focal deficits, moving all 4 extremities. Psychiatric: Normal mood and affect.  Laboratory Data: Lab Results  Component Value Date   WBC 10.4 12/13/2019   HGB 13.1 12/13/2019   HCT 42.1 12/13/2019   MCV 90.7 12/13/2019   PLT 194 12/13/2019    Lab Results  Component Value Date   CREATININE 1.25 (H) 12/13/2019    Lab Results  Component Value Date   PSA 2.22 10/18/2007   PSA 1.51 05/08/2006   PSA NORMAL 03/16/2005    No results found for: TESTOSTERONE  No results found for: HGBA1C  Urinalysis    Component Value Date/Time   COLORURINE YELLOW 12/13/2019 [place] 12/13/2019 1259   LABSPEC 1.012 12/13/2019 [place] 5.0 12/13/2019 1259   GLUCOSEU NEGATIVE 12/13/2019 [place] 12/13/2019 1259   HGBUR negative 07/19/2007 [place] 12/13/2019 1259   BILIRUBINUR negative 12/13/2019 [place] 12/13/2019 1259   KETONESUR negative 12/13/2019 [place] 12/13/2019 1259   PROTEINUR negative 12/13/2019 1115  UROBILINOGEN 0.2 12/13/2019 1115   UROBILINOGEN 1.0 07/19/2007 0918   NITRITE NEGATIVE 12/13/2019 1259   NITRITE Negative 12/13/2019 1115   LEUKOCYTESUR NEGATIVE 12/13/2019 1259   LEUKOCYTESUR Negative 12/13/2019 1115    No results found for: LABMICR, WBCUA, RBCUA, LABEPIT, MUCUS, BACTERIA  Pertinent Imaging:  No results found for this or any previous visit.  No results found for this or any previous visit.  No results found for this or any previous  visit.  No results found for this or any previous visit.  No results found for this or any previous visit.  No results found for this or any previous visit.  No results found for this or any previous visit.  No results found for this or any previous visit.   Assessment & Plan:    1. Urinary retention Foley replaced today and patient was started on rapaflo 8mg  daily. He will followup in 1 week for a voiding trial. If he fails his voiding trial we will proceed with cystoscopy  2. Benign prostatic hyperplasia with urinary obstruction -rapaflo 8mg  daily   Return in about 1 week (around 03/25/2020) for voiding trial and possible cystoscopy.  Nicolette Bang, MD  [facility] [place]

## 2020-03-18 NOTE — Progress Notes (Signed)
Pt came to office unable to urinate. Dr Alyson Ingles saw pt then ordered his cath to be changed. The cath was stopped up and no urine running into the bag.      Cath Change/ Replacement  Patient is present today for a catheter change due to urinary retention.  1ml of water was removed from the balloon, a 16FR foley cath was removed with out difficulty.  Patient was cleaned and prepped in a sterile fashion with betadine. A 16 FR foley cath was replaced into the bladder no complications were noted Urine return was noted 545ml and urine was dark orange in color. The balloon was filled with 85ml of sterile water. A leg bag was attached for drainage Patient was given proper instruction on catheter care.    Performed by: Antionette Char, Johnny Lubitz,LPN  Follow up: 1 week

## 2020-03-20 ENCOUNTER — Ambulatory Visit: Payer: Medicare HMO | Admitting: Urology

## 2020-03-23 LAB — CULTURE, URINE COMPREHENSIVE

## 2020-03-25 ENCOUNTER — Encounter: Payer: Self-pay | Admitting: Urology

## 2020-03-25 DIAGNOSIS — R030 Elevated blood-pressure reading, without diagnosis of hypertension: Secondary | ICD-10-CM | POA: Diagnosis not present

## 2020-03-25 DIAGNOSIS — T83091A Other mechanical complication of indwelling urethral catheter, initial encounter: Secondary | ICD-10-CM | POA: Diagnosis not present

## 2020-03-25 DIAGNOSIS — T8389XA Other specified complication of genitourinary prosthetic devices, implants and grafts, initial encounter: Secondary | ICD-10-CM | POA: Diagnosis not present

## 2020-03-25 DIAGNOSIS — I10 Essential (primary) hypertension: Secondary | ICD-10-CM | POA: Diagnosis not present

## 2020-03-27 ENCOUNTER — Encounter: Payer: Self-pay | Admitting: Urology

## 2020-03-27 ENCOUNTER — Ambulatory Visit (INDEPENDENT_AMBULATORY_CARE_PROVIDER_SITE_OTHER): Payer: Medicare HMO | Admitting: Urology

## 2020-03-27 ENCOUNTER — Other Ambulatory Visit: Payer: Self-pay

## 2020-03-27 VITALS — BP 193/84 | HR 58 | Temp 98.3°F | Resp 16 | Ht 69.0 in | Wt 260.0 lb

## 2020-03-27 DIAGNOSIS — R339 Retention of urine, unspecified: Secondary | ICD-10-CM | POA: Diagnosis not present

## 2020-03-27 MED ORDER — CIPROFLOXACIN HCL 500 MG PO TABS
500.0000 mg | ORAL_TABLET | Freq: Once | ORAL | Status: AC
  Administered 2020-03-27: 500 mg via ORAL

## 2020-03-27 NOTE — Progress Notes (Signed)
Fill and Pull Catheter Removal  Patient is present today for a catheter removal.  Patient was cleaned and prepped in a sterile fashion 66ml of sterile water/ saline was instilled into the bladder when the patient felt the urge to urinate. 5ml of water was then drained from the balloon.  A 18FR foley cath was removed from the bladder no complications were noted .  Patient as then given some time to void on their own.  Patient can void  - voided before going to uroflowmetry machine.  Patient tolerated well.  Performed by: Estill Bamberg RN   Simple Catheter Placement  Due to urinary retention patient is present today for a foley cath placement.  Patient was cleaned and prepped in a sterile fashion with betadine. A 16 FR foley catheter was inserted, urine return was noted  341ml, urine was yellow in color.  The balloon was filled with 10cc of sterile water.  A leg bag was attached for drainage. Patient was also given a night bag to take home and was given instruction on how to change from one bag to another.  Patient was given instruction on proper catheter care.  Patient tolerated well, no complications were noted   Performed by: Estill Bamberg RN  Additional notes/ Follow up: patient to be scheduled for surgery

## 2020-03-27 NOTE — Progress Notes (Signed)
   03/27/20  CC:  Chief Complaint  Patient presents with  . Cysto    HPI:  Blood pressure (!) 193/84, pulse (!) 58, temperature 98.3 F (36.8 C), temperature source Oral, resp. rate 16, height 5\' 9"  (1.753 m), weight 260 lb (117.9 kg). NED. A&Ox3.   No respiratory distress   Abd soft, NT, ND Normal phallus with bilateral descended testicles  Cystoscopy Procedure Note  Patient identification was confirmed, informed consent was obtained, and patient was prepped using Betadine solution.  Lidocaine jelly was administered per urethral meatus.     Pre-Procedure: - Inspection reveals a normal caliber ureteral meatus.  Procedure: The flexible cystoscope was introduced without difficulty - No urethral strictures/lesions are present. - Enlarged prostate no median lobe - Normal bladder neck - Bilateral ureteral orifices identified - Bladder mucosa  reveals no ulcers, tumors, or lesions - No bladder stones - No trabeculation  Retroflexion shows no intravesical prostatic protrusion   Post-Procedure: - Patient tolerated the procedure well  Assessment/ Plan: We discussed the management of his BPH including continued medical therapy, Rezum, Urolift, TURP and simple prostatectomy. After discussing the options the patient has elected to proceed with Urolift. Risks/benefits/alternatives discussed.   No follow-ups on file.  Nicolette Bang, MD

## 2020-03-31 ENCOUNTER — Other Ambulatory Visit: Payer: Self-pay

## 2020-03-31 ENCOUNTER — Telehealth: Payer: Self-pay

## 2020-03-31 MED ORDER — NITROFURANTOIN MONOHYD MACRO 100 MG PO CAPS: 100.0000 mg | ORAL_CAPSULE | Freq: Two times a day (BID) | ORAL | 0 refills | Status: DC

## 2020-03-31 NOTE — Telephone Encounter (Signed)
-----   Message from Dorisann Frames, RN sent at 03/31/2020 11:33 AM EDT -----  ----- Message ----- From: Cleon Gustin, MD Sent: 03/31/2020  10:45 AM EDT To: Dorisann Frames, RN  Macrobid 100mg  BID for 7 days ----- Message ----- From: Dorisann Frames, RN Sent: 03/23/2020   4:19 PM EDT To: Cleon Gustin, MD  Please review

## 2020-03-31 NOTE — Telephone Encounter (Signed)
Med sent in and pt notifed.

## 2020-04-06 ENCOUNTER — Encounter: Payer: Self-pay | Admitting: Urology

## 2020-04-06 NOTE — Patient Instructions (Signed)

## 2020-04-13 ENCOUNTER — Ambulatory Visit: Payer: Medicare HMO | Admitting: Cardiology

## 2020-04-28 DIAGNOSIS — Z6841 Body Mass Index (BMI) 40.0 and over, adult: Secondary | ICD-10-CM | POA: Diagnosis not present

## 2020-04-28 DIAGNOSIS — I2699 Other pulmonary embolism without acute cor pulmonale: Secondary | ICD-10-CM | POA: Diagnosis not present

## 2020-04-28 DIAGNOSIS — Z0000 Encounter for general adult medical examination without abnormal findings: Secondary | ICD-10-CM | POA: Diagnosis not present

## 2020-04-28 DIAGNOSIS — J4541 Moderate persistent asthma with (acute) exacerbation: Secondary | ICD-10-CM | POA: Diagnosis not present

## 2020-04-28 DIAGNOSIS — I10 Essential (primary) hypertension: Secondary | ICD-10-CM | POA: Diagnosis not present

## 2020-04-28 DIAGNOSIS — N400 Enlarged prostate without lower urinary tract symptoms: Secondary | ICD-10-CM | POA: Diagnosis not present

## 2020-04-30 NOTE — Patient Instructions (Signed)
Johnny Fischer  04/30/2020     @PREFPERIOPPHARMACY @   Your procedure is scheduled on  05/04/2020.   Report to [facility] at  0800  A.M.   Call this number if you have problems the morning of surgery:  [phone]   Remember:  Do not eat or drink after midnight.                        Take these medicines the morning of surgery with A SIP OF WATER  None   Place clean sheets on your bed the night before your procedure and DO NOT sleep with pets this night.  Shower with CHG the night before and the morning of your procedure. DO NOT put CHG on your face, hair or genitals.  After each shower, dry off with a clean towel, put on clean, comfortable clothe and brush your teeth,      Do not wear jewelry, make-up or nail polish.  Do not wear lotions, powders, or perfumes, or deodorant.  Do not shave 48 hours prior to surgery.  Men may shave face and neck.  Do not bring valuables to the hospital.  [facility] is not responsible for any belongings or valuables.  Contacts, dentures or bridgework may not be worn into surgery.  Leave your suitcase in the car.  After surgery it may be brought to your room.  For patients admitted to the hospital, discharge time will be determined by your treatment team.  Patients discharged the day of surgery will not be allowed to drive home and must have someone with them for 24 hours.   Special instructions:  DO NOT smoke tobacco or vape for 24 hours before your procedure.  Please read over the following fact sheets that you were given. Coughing and Deep Breathing, Surgical Site Infection Prevention, Anesthesia Post-op Instructions and Care and Recovery After Surgery       Cystoscopy Cystoscopy is a procedure that is used to help diagnose and sometimes treat conditions that affect the lower urinary tract. The lower urinary tract includes the bladder and the urethra. The urethra is the tube that drains urine from the bladder. Cystoscopy  is done using a thin, tube-shaped instrument with a light and camera at the end (cystoscope). The cystoscope may be hard or flexible, depending on the goal of the procedure. The cystoscope is inserted through the urethra, into the bladder. Cystoscopy may be recommended if you have:  Urinary tract infections that keep coming back.  Blood in the urine (hematuria).  An inability to control when you urinate (urinary incontinence) or an overactive bladder.  Unusual cells found in a urine sample.  A blockage in the urethra, such as a urinary stone.  Painful urination.  An abnormality in the bladder found during an intravenous pyelogram (IVP) or CT scan. Cystoscopy may also be done to remove a sample of tissue to be examined under a microscope (biopsy). Tell a health care provider about:  Any allergies you have.  All medicines you are taking, including vitamins, herbs, eye drops, creams, and over-the-counter medicines.  Any problems you or family members have had with anesthetic medicines.  Any blood disorders you have.  Any surgeries you have had.  Any medical conditions you have.  Whether you are pregnant or may be pregnant. What are the risks? Generally, this is a safe procedure. However, problems may occur, including:  Infection.  Bleeding.  Allergic reactions  to medicines.  Damage to other structures or organs. What happens before the procedure? Medicines Ask your health care provider about:  Changing or stopping your regular medicines. This is especially important if you are taking diabetes medicines or blood thinners.  Taking medicines such as aspirin and ibuprofen. These medicines can thin your blood. Do not take these medicines unless your health care provider tells you to take them.  Taking over-the-counter medicines, vitamins, herbs, and supplements. Tests You may have an exam or testing, such as:  X-rays of the bladder, urethra, or kidneys.  CT scan of the  abdomen or pelvis.  Urine tests to check for signs of infection. General instructions  Follow instructions from your health care provider about eating or drinking restrictions.  Ask your health care provider what steps will be taken to help prevent infection. These steps may include: ? Washing skin with a germ-killing soap. ? Taking antibiotic medicine.  Plan to have a responsible adult take you home from the hospital or clinic. What happens during the procedure?  You will be given one or more of the following: ? A medicine to help you relax (sedative). ? A medicine to numb the area (local anesthetic).  The area around the opening of your urethra will be cleaned.  The cystoscope will be passed through your urethra into your bladder.  Germ-free (sterile) fluid will flow through the cystoscope to fill your bladder. The fluid will stretch your bladder so that your health care provider can clearly examine your bladder walls.  Your doctor will look at the urethra and bladder. Your doctor may take a biopsy or remove stones.  The cystoscope will be removed, and your bladder will be emptied. The procedure may vary among health care providers and hospitals.   What can I expect after the procedure? After the procedure, it is common to have:  Some soreness or pain in your abdomen and urethra.  Urinary symptoms. These include: ? Mild pain or burning when you urinate. Pain should stop within a few minutes after you urinate. This may last for up to 1 week. ? A small amount of blood in your urine for several days. ? Feeling like you need to urinate but producing only a small amount of urine. Follow these instructions at home: Medicines  Take over-the-counter and prescription medicines only as told by your health care provider.  If you were prescribed an antibiotic medicine, take it as told by your health care provider. Do not stop taking the antibiotic even if you start to feel  better. General instructions  Return to your normal activities as told by your health care provider. Ask your health care provider what activities are safe for you.  If you were given a sedative during the procedure, it can affect you for several hours. Do not drive or operate machinery until your health care provider says that it is safe.  Watch for any blood in your urine. If the amount of blood in your urine increases, call your health care provider.  Follow instructions from your health care provider about eating or drinking restrictions.  If a tissue sample was removed for testing (biopsy) during your procedure, it is up to you to get your test results. Ask your health care provider, or the department that is doing the test, when your results will be ready.  Drink enough fluid to keep your urine pale yellow.  Keep all follow-up visits. This is important. Contact a health care provider if:  You have pain that gets worse or does not get better with medicine, especially pain when you urinate.  You have trouble urinating.  You have more blood in your urine. Get help right away if:  You have blood clots in your urine.  You have abdominal pain.  You have a fever or chills.  You are unable to urinate. Summary  Cystoscopy is a procedure that is used to help diagnose and sometimes treat conditions that affect the lower urinary tract.  Cystoscopy is done using a thin, tube-shaped instrument with a light and camera at the end.  After the procedure, it is common to have some soreness or pain in your abdomen and urethra.  Watch for any blood in your urine. If the amount of blood in your urine increases, call your health care provider.  If you were prescribed an antibiotic medicine, take it as told by your health care provider. Do not stop taking the antibiotic even if you start to feel better. This information is not intended to replace advice given to you by your health care  provider. Make sure you discuss any questions you have with your health care provider. Document Revised: 08/02/2019 Document Reviewed: 08/02/2019 Elsevier Patient Education  2021 [place] Anesthesia, Adult, Care After This sheet gives you information about how to care for yourself after your procedure. Your health care provider may also give you more specific instructions. If you have problems or questions, contact your health care provider. What can I expect after the procedure? After the procedure, the following side effects are common:  Pain or discomfort at the IV site.  Nausea.  Vomiting.  Sore throat.  Trouble concentrating.  Feeling cold or chills.  Feeling weak or tired.  Sleepiness and fatigue.  Soreness and body aches. These side effects can affect parts of the body that were not involved in surgery. Follow these instructions at home: For the time period you were told by your health care provider:  Rest.  Do not participate in activities where you could fall or become injured.  Do not drive or use machinery.  Do not drink alcohol.  Do not take sleeping pills or medicines that cause drowsiness.  Do not make important decisions or sign legal documents.  Do not take care of children on your own.   Eating and drinking  Follow any instructions from your health care provider about eating or drinking restrictions.  When you feel hungry, start by eating small amounts of foods that are soft and easy to digest (bland), such as toast. Gradually return to your regular diet.  Drink enough fluid to keep your urine pale yellow.  If you vomit, rehydrate by drinking water, juice, or clear broth. General instructions  If you have sleep apnea, surgery and certain medicines can increase your risk for breathing problems. Follow instructions from your health care provider about wearing your sleep device: ? Anytime you are sleeping, including during daytime  naps. ? While taking prescription pain medicines, sleeping medicines, or medicines that make you drowsy.  Have a responsible adult stay with you for the time you are told. It is important to have someone help care for you until you are awake and alert.  Return to your normal activities as told by your health care provider. Ask your health care provider what activities are safe for you.  Take over-the-counter and prescription medicines only as told by your health care provider.  If you smoke, do not smoke without supervision.  Keep all follow-up visits as told by your health care provider. This is important. Contact a health care provider if:  You have nausea or vomiting that does not get better with medicine.  You cannot eat or drink without vomiting.  You have pain that does not get better with medicine.  You are unable to pass urine.  You develop a skin rash.  You have a fever.  You have redness around your IV site that gets worse. Get help right away if:  You have difficulty breathing.  You have chest pain.  You have blood in your urine or stool, or you vomit blood. Summary  After the procedure, it is common to have a sore throat or nausea. It is also common to feel tired.  Have a responsible adult stay with you for the time you are told. It is important to have someone help care for you until you are awake and alert.  When you feel hungry, start by eating small amounts of foods that are soft and easy to digest (bland), such as toast. Gradually return to your regular diet.  Drink enough fluid to keep your urine pale yellow.  Return to your normal activities as told by your health care provider. Ask your health care provider what activities are safe for you. This information is not intended to replace advice given to you by your health care provider. Make sure you discuss any questions you have with your health care provider. Document Revised: 09/05/2019 Document  Reviewed: 04/04/2019 Elsevier Patient Education  2021 [employer].

## 2020-05-01 ENCOUNTER — Encounter (HOSPITAL_COMMUNITY): Payer: Self-pay

## 2020-05-01 ENCOUNTER — Other Ambulatory Visit: Payer: Self-pay

## 2020-05-01 ENCOUNTER — Encounter (HOSPITAL_COMMUNITY)
Admission: RE | Admit: 2020-05-01 | Discharge: 2020-05-01 | Disposition: A | Discharge status: Home / Self Care | Payer: Medicare HMO | Source: Ambulatory Visit | Attending: Urology | Admitting: Urology

## 2020-05-01 ENCOUNTER — Other Ambulatory Visit (HOSPITAL_COMMUNITY)
Admission: RE | Admit: 2020-05-01 | Discharge: 2020-05-01 | Disposition: A | Discharge status: Home / Self Care | Payer: Medicare HMO | Source: Ambulatory Visit | Attending: Urology | Admitting: Urology

## 2020-05-01 DIAGNOSIS — Z01812 Encounter for preprocedural laboratory examination: Secondary | ICD-10-CM | POA: Diagnosis not present

## 2020-05-01 DIAGNOSIS — Z20822 Contact with and (suspected) exposure to covid-19: Secondary | ICD-10-CM | POA: Diagnosis not present

## 2020-05-01 LAB — SARS CORONAVIRUS 2 (TAT 6-24 HRS): SARS Coronavirus 2: NEGATIVE

## 2020-05-04 ENCOUNTER — Encounter (HOSPITAL_COMMUNITY)
Admission: RE | Disposition: A | Discharge status: Home / Self Care | Payer: Self-pay | Source: Home / Self Care | Attending: Urology

## 2020-05-04 ENCOUNTER — Ambulatory Visit (HOSPITAL_COMMUNITY): Payer: Medicare HMO | Admitting: Anesthesiology

## 2020-05-04 ENCOUNTER — Ambulatory Visit (HOSPITAL_COMMUNITY)
Admission: RE | Admit: 2020-05-04 | Discharge: 2020-05-04 | Disposition: A | Discharge status: Home / Self Care | Payer: Medicare HMO | Attending: Urology | Admitting: Urology

## 2020-05-04 ENCOUNTER — Other Ambulatory Visit: Payer: Self-pay

## 2020-05-04 ENCOUNTER — Encounter (HOSPITAL_COMMUNITY): Payer: Self-pay | Admitting: Urology

## 2020-05-04 DIAGNOSIS — N401 Enlarged prostate with lower urinary tract symptoms: Secondary | ICD-10-CM

## 2020-05-04 DIAGNOSIS — N138 Other obstructive and reflux uropathy: Secondary | ICD-10-CM | POA: Insufficient documentation

## 2020-05-04 DIAGNOSIS — Z96651 Presence of right artificial knee joint: Secondary | ICD-10-CM | POA: Insufficient documentation

## 2020-05-04 HISTORY — PX: CYSTOSCOPY: SHX5120

## 2020-05-04 HISTORY — PX: CYSTOSCOPY WITH INSERTION OF UROLIFT: SHX6678

## 2020-05-04 SURGERY — CYSTOSCOPY WITH INSERTION OF UROLIFT
Anesthesia: General

## 2020-05-04 MED ORDER — MIDAZOLAM HCL 2 MG/2ML IJ SOLN
INTRAMUSCULAR | Status: AC
  Filled 2020-05-04: qty 2

## 2020-05-04 MED ORDER — FENTANYL CITRATE (PF) 100 MCG/2ML IJ SOLN
INTRAMUSCULAR | Status: DC | PRN
  Administered 2020-05-04 (×2): 25 ug via INTRAVENOUS
  Administered 2020-05-04: 50 ug via INTRAVENOUS

## 2020-05-04 MED ORDER — CHLORHEXIDINE GLUCONATE 0.12 % MT SOLN
15.0000 mL | Freq: Once | OROMUCOSAL | Status: AC
  Administered 2020-05-04: 15 mL via OROMUCOSAL

## 2020-05-04 MED ORDER — ONDANSETRON HCL 4 MG/2ML IJ SOLN
INTRAMUSCULAR | Status: AC
  Filled 2020-05-04: qty 2

## 2020-05-04 MED ORDER — LIDOCAINE HCL (PF) 2 % IJ SOLN
INTRAMUSCULAR | Status: AC
  Filled 2020-05-04: qty 10

## 2020-05-04 MED ORDER — ONDANSETRON HCL 4 MG/2ML IJ SOLN: 4.0000 mg | Freq: Once | INTRAMUSCULAR | Status: DC | PRN

## 2020-05-04 MED ORDER — LACTATED RINGERS IV SOLN: INTRAVENOUS | Status: DC

## 2020-05-04 MED ORDER — WATER FOR IRRIGATION, STERILE IR SOLN
Status: DC | PRN
  Administered 2020-05-04: 500 mL

## 2020-05-04 MED ORDER — LIDOCAINE HCL URETHRAL/MUCOSAL 2 % EX GEL
CUTANEOUS | Status: DC | PRN
  Administered 2020-05-04: 1

## 2020-05-04 MED ORDER — CEFAZOLIN SODIUM-DEXTROSE 2-4 GM/100ML-% IV SOLN
INTRAVENOUS | Status: AC
  Filled 2020-05-04: qty 100

## 2020-05-04 MED ORDER — FENTANYL CITRATE (PF) 100 MCG/2ML IJ SOLN
25.0000 ug | INTRAMUSCULAR | Status: DC | PRN
  Administered 2020-05-04 (×2): 25 ug via INTRAVENOUS
  Filled 2020-05-04: qty 2

## 2020-05-04 MED ORDER — ONDANSETRON HCL 4 MG/2ML IJ SOLN
INTRAMUSCULAR | Status: DC | PRN
  Administered 2020-05-04: 4 mg via INTRAVENOUS

## 2020-05-04 MED ORDER — PROPOFOL 500 MG/50ML IV EMUL
INTRAVENOUS | Status: DC | PRN
  Administered 2020-05-04: 75 ug/kg/min via INTRAVENOUS

## 2020-05-04 MED ORDER — CEFAZOLIN SODIUM-DEXTROSE 2-4 GM/100ML-% IV SOLN
2.0000 g | INTRAVENOUS | Status: AC
  Administered 2020-05-04: 2 g via INTRAVENOUS

## 2020-05-04 MED ORDER — FENTANYL CITRATE (PF) 100 MCG/2ML IJ SOLN
INTRAMUSCULAR | Status: AC
  Filled 2020-05-04: qty 2

## 2020-05-04 MED ORDER — MIDAZOLAM HCL 5 MG/5ML IJ SOLN
INTRAMUSCULAR | Status: DC | PRN
  Administered 2020-05-04: 2 mg via INTRAVENOUS

## 2020-05-04 MED ORDER — ORAL CARE MOUTH RINSE: 15.0000 mL | Freq: Once | OROMUCOSAL | Status: AC

## 2020-05-04 MED ORDER — LIDOCAINE HCL URETHRAL/MUCOSAL 2 % EX GEL
CUTANEOUS | Status: AC
  Filled 2020-05-04: qty 10

## 2020-05-04 MED ORDER — PROPOFOL 10 MG/ML IV BOLUS
INTRAVENOUS | Status: AC
  Filled 2020-05-04: qty 20

## 2020-05-04 MED ORDER — STERILE WATER FOR IRRIGATION IR SOLN
Status: DC | PRN
  Administered 2020-05-04: 3000 mL

## 2020-05-04 SURGICAL SUPPLY — 21 items
BAG DRAIN URO TABLE W/ADPT NS (BAG) ×2 IMPLANT
BAG DRN 8 ADPR NS SKTRN CSTL (BAG) ×1
BAG DRN RND TRDRP ANRFLXCHMBR (UROLOGICAL SUPPLIES) ×1
BAG HAMPER (MISCELLANEOUS) ×2 IMPLANT
BAG URINE DRAIN 2000ML AR STRL (UROLOGICAL SUPPLIES) ×2 IMPLANT
CATH FOLEY 2WAY SLVR  5CC 20FR (CATHETERS) ×2
CATH FOLEY 2WAY SLVR 5CC 20FR (CATHETERS) ×1 IMPLANT
CLOTH BEACON ORANGE TIMEOUT ST (SAFETY) ×2 IMPLANT
GLOVE BIO SURGEON STRL SZ8 (GLOVE) ×2 IMPLANT
GLOVE SURG UNDER POLY LF SZ7 (GLOVE) ×4 IMPLANT
GOWN STRL REUS W/TWL LRG LVL3 (GOWN DISPOSABLE) ×4 IMPLANT
GOWN STRL REUS W/TWL XL LVL3 (GOWN DISPOSABLE) ×2 IMPLANT
KIT TURNOVER CYSTO (KITS) ×2 IMPLANT
MANIFOLD NEPTUNE II (INSTRUMENTS) ×2 IMPLANT
PACK CYSTO (CUSTOM PROCEDURE TRAY) ×2 IMPLANT
PAD ARMBOARD 7.5X6 YLW CONV (MISCELLANEOUS) ×2 IMPLANT
SYSTEM UROLIFT (Male Continence) ×8 IMPLANT
TOWEL NATURAL 4PK STERILE (DISPOSABLE) ×2 IMPLANT
TOWEL OR 17X26 4PK STRL BLUE (TOWEL DISPOSABLE) ×2 IMPLANT
WATER STERILE IRR 3000ML UROMA (IV SOLUTION) ×2 IMPLANT
WATER STERILE IRR 500ML POUR (IV SOLUTION) ×2 IMPLANT

## 2020-05-04 NOTE — Discharge Instructions (Signed)
Indwelling Urinary Catheter Care, Adult °An indwelling urinary catheter is a thin tube that is put into your bladder. The tube helps to drain pee (urine) out of your body. The tube goes in through your urethra. Your urethra is where pee comes out of your body. Your pee will come out through the catheter, then it will go into a bag (drainage bag). °Take good care of your catheter so it will work well. °How to wear your catheter and bag °Supplies needed °· Sticky tape (adhesive tape) or a leg strap. °· Alcohol wipe or soap and water (if you use tape). °· A clean towel (if you use tape). °· Large overnight bag. °· Smaller bag (leg bag). °Wearing your catheter °Attach your catheter to your leg with tape or a leg strap. °· Make sure the catheter is not pulled tight. °· If a leg strap gets wet, take it off and put on a dry strap. °· If you use tape to hold the bag on your leg: °1. Use an alcohol wipe or soap and water to wash your skin where the tape made it sticky before. °2. Use a clean towel to pat-dry that skin. °3. Use new tape to make the bag stay on your leg. °Wearing your bags °You should have been given a large overnight bag. °· You may wear the overnight bag in the day or night. °· Always have the overnight bag lower than your bladder.  Do not let the bag touch the floor. °· Before you go to sleep, put a clean plastic bag in a wastebasket. Then hang the overnight bag inside the wastebasket. °You should also have a smaller leg bag that fits under your clothes. °· Always wear the leg bag below your knee. °· Do not wear your leg bag at night. °How to care for your skin and catheter °Supplies needed °· A clean washcloth. °· Water and mild soap. °· A clean towel. °Caring for your skin and catheter °· Clean the skin around your catheter every day: °1. Wash your hands with soap and water. °2. Wet a clean washcloth in warm water and mild soap. °3. Clean the skin around your urethra. °§ If you are male: °§ Gently  spread the folds of skin around your vagina (labia). °§ With the washcloth in your other hand, wipe the inner side of your labia on each side. Wipe from front to back. °§ If you are male: °§ Pull back any skin that covers the end of your penis (foreskin). °§ With the washcloth in your other hand, wipe your penis in small circles. Start wiping at the tip of your penis, then move away from the catheter. °§ Move the foreskin back in place, if needed. °4. With your free hand, hold the catheter close to where it goes into your body. °§ Keep holding the catheter during cleaning so it does not get pulled out. °5. With the washcloth in your other hand, clean the catheter. °§ Only wipe downward on the catheter. °§ Do not wipe upward toward your body. Doing this may push germs into your urethra and cause infection. °6. Use a clean towel to pat-dry the catheter and the skin around it. Make sure to wipe off all soap. °7. Wash your hands with soap and water. °· Shower every day. Do not take baths. °· Do not use cream, ointment, or lotion on the area where the catheter goes into your body, unless your doctor tells you to. °· Do not   use powders, sprays, or lotions on your genital area. °· Check your skin around the catheter every day for signs of infection. Check for: °? Redness, swelling, or pain. °? Fluid or blood. °? Warmth. °? Pus or a bad smell.  °  °  °How to empty the bag °Supplies needed °· Rubbing alcohol. °· Gauze pad or cotton ball. °· Tape or a leg strap. °Emptying the bag °Pour the pee out of your bag when it is ?-½ full, or at least 2-3 times a day. Do this for your overnight bag and your leg bag. °1. Wash your hands with soap and water. °2. Separate (detach) the bag from your leg. °3. Hold the bag over the toilet or a clean pail. Keep the bag lower than your hips and bladder. This is so the pee (urine) does not go back into the tube. °4. Open the pour spout. It is at the bottom of the bag. °5. Empty the pee into the  toilet or pail. Do not let the pour spout touch any surface. °6. Put rubbing alcohol on a gauze pad or cotton ball. °7. Use the gauze pad or cotton ball to clean the pour spout. °8. Close the pour spout. °9. Attach the bag to your leg with tape or a leg strap. °10. Wash your hands with soap and water. °Follow instructions for cleaning the drainage bag: °· From the product maker. °· As told by your doctor. °How to change the bag °Supplies needed °· Alcohol wipes. °· A clean bag. °· Tape or a leg strap. °Changing the bag °Replace your bag when it starts to leak, smell bad, or look dirty. °1. Wash your hands with soap and water. °2. Separate the dirty bag from your leg. °3. Pinch the catheter with your fingers so that pee does not spill out. °4. Separate the catheter tube from the bag tube where these tubes connect (at the connection valve). Do not let the tubes touch any surface. °5. Clean the end of the catheter tube with an alcohol wipe. Use a different alcohol wipe to clean the end of the bag tube. °6. Connect the catheter tube to the tube of the clean bag. °7. Attach the clean bag to your leg with tape or a leg strap. Do not make the bag tight on your leg. °8. Wash your hands with soap and water. °General rules °· Never pull on your catheter. Never try to take it out. Doing that can hurt you. °· Always wash your hands before and after you touch your catheter or bag. Use a mild, fragrance-free soap. If you do not have soap and water, use hand sanitizer. °· Always make sure there are no twists or bends (kinks) in the catheter tube. °· Always make sure there are no leaks in the catheter or bag. °· Drink enough fluid to keep your pee pale yellow. °· Do not take baths, swim, or use a hot tub. °· If you are male, wipe from front to back after you poop (have a bowel movement).   °Contact a doctor if: °· Your pee is cloudy. °· Your pee smells worse than usual. °· Your catheter gets clogged. °· Your catheter  leaks. °· Your bladder feels full. °Get help right away if: °· You have redness, swelling, or pain where the catheter goes into your body. °· You have fluid, blood, pus, or a bad smell coming from the area where the catheter goes into your body. °· Your skin feels   warm where the catheter goes into your body. °· You have a fever. °· You have pain in your: °? Belly (abdomen). °? Legs. °? Lower back. °? Bladder. °· You see blood in the catheter. °· Your pee is pink or red. °· You feel sick to your stomach (nauseous). °· You throw up (vomit). °· You have chills. °· Your pee is not draining into the bag. °· Your catheter gets pulled out. °Summary °· An indwelling urinary catheter is a thin tube that is placed into the bladder to help drain pee (urine) out of the body. °· The catheter is placed into the part of the body that drains pee from the bladder (urethra). °· Taking good care of your catheter will keep it working properly and help prevent problems. °· Always wash your hands before and after touching your catheter or bag. °· Never pull on your catheter or try to take it out. °This information is not intended to replace advice given to you by your health care provider. Make sure you discuss any questions you have with your health care provider. °Document Revised: 04/13/2018 Document Reviewed: 08/05/2016 °Elsevier Patient Education © 2021 [employer] ° °

## 2020-05-04 NOTE — Op Note (Signed)
   PREOPERATIVE DIAGNOSIS: Benign prostatic hypertrophy with bladder outlet obstruction.  POSTOPERATIVE DIAGNOSIS: Benign prostatic hypertrophy with bladder outlet obstruction.  PROCEDURE: Cystoscopy with implantation of UroLift devices, 4 implants.  SURGEON: Nicolette Bang, M.D.  ANESTHESIA: General  ANTIBIOTICS: ancef  SPECIMEN: None.  DRAINS: A 16-French Foley catheter.  BLOOD LOSS: Minimal.  COMPLICATIONS: None.  FINDINGS: bilobar hyperplasia. 4 implants placed. 1 implant midfired  INDICATIONS:The Patient is an 75 year old male with BPH and bladder outlet obstruction. He has failed medical therapy and has elected UroLift for definitive treatment.  FINDINGS OF PROCEDURE: He was taken to the operating room where a genral anesthetic was induced. He was placed in lithotomy position and was fitted with PAS hose. His perineum and genitalia were prepped with chlorhexidine, and he was draped in usual sterile fashion.  Cystoscopy was performed using the UroLift scope and 0 degree lens. Examination revealed a normal urethra. The external sphincter was intact. Prostatic urethra was approximately 4 cm in length with lateral lobe enlargement. There was also little bit of bladder neck elevation. Inspection of bladder revealed mild-to-moderate trabeculation with no tumors, stones, or inflammation. No cellules or diverticula were noted. Ureteral orifices were in their normal anatomic position effluxing clear urine.  After initial cystoscopy, the visual obturator was replaced with the first UroLift device. This was turned to the 9 o'clock position and pulled back to the veru and then slightly advanced. Pressure was then applied to the right lateral lobe and the UroLift device was deployed.  The second UroLift device was then inserted and applied to the left lateral lobe at 3 o'clock and deployed in the mid prostatic urethra. After this, there was still some  apparent obstruction closer to the bladder neck. So a second level of UroLift your left device was applied between the mid urethra and the proximal urethra providing further patency to the prostatic urethra. At this point, there was mild bleeding but the patient did have a spinal anesthetic. So it was thought that a Foley catheter was indicated. The scope was removed and a 16-French Foley catheter was inserted without difficulty. The balloon was filled with 10 mL sterile fluid, and the catheter was placed to straight drainage.  COMPLICATIONS: None   CONDITION: Stable, extubated, transferred to PACU  PLAN: The patient will be discharged home and followup in 2 days for a voiding trial.

## 2020-05-04 NOTE — Transfer of Care (Signed)
Immediate Anesthesia Transfer of Care Note  Patient: Johnny Fischer  Procedure(s) Performed: CYSTOSCOPY WITH INSERTION OF UROLIFT (N/A ) CYSTOSCOPY (N/A )  Patient Location: PACU  Anesthesia Type:MAC  Level of Consciousness: awake, alert , oriented and patient cooperative  Airway & Oxygen Therapy: Patient Spontanous Breathing and Patient connected to nasal cannula oxygen  Post-op Assessment: Report given to RN, Post -op Vital signs reviewed and stable and Patient moving all extremities  Post vital signs: Reviewed and stable  Last Vitals:  Vitals Value Taken Time  BP 135/72 05/04/20 1018  Temp    Pulse 59 05/04/20 1019  Resp 14 05/04/20 1019  SpO2 95 % 05/04/20 1019  Vitals shown include unvalidated device data.  Last Pain:  Vitals:   05/04/20 0802  TempSrc: Oral  PainSc: 0-No pain         Complications: No complications documented.

## 2020-05-04 NOTE — Anesthesia Preprocedure Evaluation (Signed)
Anesthesia Evaluation  Patient identified by MRN, date of birth, ID band Patient awake    Reviewed: Allergy & Precautions, NPO status , Patient's Chart, lab work & pertinent test results  History of Anesthesia Complications Negative for: history of anesthetic complications  Airway Mallampati: III  TM Distance: >3 FB Neck ROM: Full    Dental  (+) Dental Advisory Given, Loose, Missing,    Pulmonary    Pulmonary exam normal breath sounds clear to auscultation       Cardiovascular Exercise Tolerance: Good hypertension, Pt. on medications Normal cardiovascular exam Rhythm:Regular Rate:Normal     Neuro/Psych negative neurological ROS  negative psych ROS   GI/Hepatic GERD  Controlled,  Endo/Other  negative endocrine ROS  Renal/GU Renal disease     Musculoskeletal  (+) Arthritis ,   Abdominal   Peds  Hematology negative hematology ROS (+)   Anesthesia Other Findings   Reproductive/Obstetrics negative OB ROS                            Anesthesia Physical Anesthesia Plan  ASA: II  Anesthesia Plan: General   Post-op Pain Management:    Induction: Intravenous  PONV Risk Score and Plan: Propofol infusion  Airway Management Planned: Nasal Cannula and Natural Airway  Additional Equipment:   Intra-op Plan:   Post-operative Plan:   Informed Consent: I have reviewed the patients History and Physical, chart, labs and discussed the procedure including the risks, benefits and alternatives for the proposed anesthesia with the patient or authorized representative who has indicated his/her understanding and acceptance.     Dental advisory given  Plan Discussed with: CRNA and Surgeon  Anesthesia Plan Comments:        Anesthesia Quick Evaluation

## 2020-05-04 NOTE — Anesthesia Postprocedure Evaluation (Signed)
Anesthesia Post Note  Patient: Johnny Fischer  Procedure(s) Performed: CYSTOSCOPY WITH INSERTION OF UROLIFT (N/A ) CYSTOSCOPY (N/A )  Patient location during evaluation: PACU Anesthesia Type: General Level of consciousness: awake and alert and oriented Pain management: pain level controlled Vital Signs Assessment: post-procedure vital signs reviewed and stable Respiratory status: spontaneous breathing and respiratory function stable Cardiovascular status: blood pressure returned to baseline and stable Postop Assessment: no apparent nausea or vomiting Anesthetic complications: no   No complications documented.   Last Vitals:  Vitals:   05/04/20 1018 05/04/20 1030  BP: 135/72 126/76  Pulse: 68 (!) 56  Resp: 14 19  Temp: 36.4 C   SpO2: 94% 96%    Last Pain:  Vitals:   05/04/20 1030  TempSrc:   PainSc: 6                  Jamier Urbas C Leeandre Nordling

## 2020-05-04 NOTE — H&P (Signed)
Urology Admission H&P  Chief Complaint: Urinary retnetion  History of Present Illness: Mr Johnny Fischer is a 75yo here for Urolift. He has a history of BPH and has failed medical therapy. Cystoscopy revealed lateral lobe hyperplasia and no median lobe. No complaints today  Past Medical History:  Diagnosis Date  . Arthritis    Past Surgical History:  Procedure Laterality Date  . JOINT REPLACEMENT    . TOTAL KNEE ARTHROPLASTY Right     Home Medications:  Current Facility-Administered Medications  Medication Dose Route Frequency Provider Last Rate Last Admin  . ceFAZolin (ANCEF) IVPB 2g/100 mL premix  2 g Intravenous 30 min Pre-Op Rozalyn Osland, Candee Furbish, MD      . lactated ringers infusion   Intravenous Continuous Denese Killings, MD 50 mL/hr at 05/04/20 0844 Continued from Pre-op at 05/04/20 0844   Allergies: No Known Allergies  Family History  Family history unknown: Yes   Social History:  reports that he has never smoked. He has never used smokeless tobacco. He reports previous alcohol use. He reports that he does not use drugs.  Review of Systems  Genitourinary: Positive for difficulty urinating.  All other systems reviewed and are negative.   Physical Exam:  Vital signs in last 24 hours: Temp:  [98.3 F (36.8 C)] 98.3 F (36.8 C) (05/02 0802) Pulse Rate:  [62] 62 (05/02 0802) Resp:  [16] 16 (05/02 0802) BP: (157)/(96) 157/96 (05/02 0802) SpO2:  [97 %] 97 % (05/02 0802) Physical Exam Vitals reviewed.  Constitutional:      Appearance: Normal appearance.  HENT:     Head: Normocephalic and atraumatic.     Nose: Nose normal.     Mouth/Throat:     Mouth: Mucous membranes are dry.  Eyes:     Extraocular Movements: Extraocular movements intact.     Pupils: Pupils are equal, round, and reactive to light.  Cardiovascular:     Rate and Rhythm: Normal rate and regular rhythm.  Pulmonary:     Effort: Pulmonary effort is normal. No respiratory distress.  Abdominal:      General: Abdomen is flat. There is no distension.  Musculoskeletal:        General: No swelling. Normal range of motion.     Cervical back: Normal range of motion and neck supple.  Skin:    General: Skin is warm and dry.  Neurological:     General: No focal deficit present.     Mental Status: He is alert and oriented to person, place, and time.  Psychiatric:        Mood and Affect: Mood normal.        Behavior: Behavior normal.        Thought Content: Thought content normal.        Judgment: Judgment normal.     Laboratory Data:  No results found for this or any previous visit (from the past 24 hour(s)). Recent Results (from the past 240 hour(s))  SARS CORONAVIRUS 2 (TAT 6-24 HRS) Nasopharyngeal Nasopharyngeal Swab     Status: None   Collection Time: 05/01/20  7:57 AM   Specimen: Nasopharyngeal Swab  Result Value Ref Range Status   SARS Coronavirus 2 NEGATIVE NEGATIVE Final    Comment: (NOTE) SARS-CoV-2 target nucleic acids are NOT DETECTED.  The SARS-CoV-2 RNA is generally detectable in upper and lower respiratory specimens during the acute phase of infection. Negative results do not preclude SARS-CoV-2 infection, do not rule out co-infections with other pathogens, and should not be  used as the sole basis for treatment or other patient management decisions. Negative results must be combined with clinical observations, patient history, and epidemiological information. The expected result is Negative.  Fact Sheet for Patients: SugarRoll.be  Fact Sheet for Healthcare Providers: https://www.woods-mathews.com/  This test is not yet approved or cleared by the [place] FDA and  has been authorized for detection and/or diagnosis of SARS-CoV-2 by FDA under an Emergency Use Authorization (EUA). This EUA will remain  in effect (meaning this test can be used) for the duration of the COVID-19 declaration under Se ction 564(b)(1) of the  Act, 21 U.S.C. section 360bbb-3(b)(1), unless the authorization is terminated or revoked sooner.  Performed at [facility], [place] [address], [place], [place] [address]    Creatinine: No results for input(s): CREATININE in the last 168 hours. Baseline Creatinine:   Impression/Assessment:  75yo with BPH  Plan:  We discussed the management of his BPH including continued medical therapy, Rezum, Urolift, TURP and simple prostatectomy. After discussing the options the patient has elected to proceed with Urolift. Risks/benefits/alternatives discussed.   Nicolette Bang 05/04/2020, 9:11 AM

## 2020-05-05 ENCOUNTER — Encounter (HOSPITAL_COMMUNITY): Payer: Self-pay | Admitting: Urology

## 2020-05-06 ENCOUNTER — Ambulatory Visit: Payer: Medicare HMO

## 2020-05-06 ENCOUNTER — Other Ambulatory Visit: Payer: Self-pay

## 2020-05-06 ENCOUNTER — Ambulatory Visit (INDEPENDENT_AMBULATORY_CARE_PROVIDER_SITE_OTHER): Payer: Medicare HMO | Admitting: Urology

## 2020-05-06 DIAGNOSIS — N401 Enlarged prostate with lower urinary tract symptoms: Secondary | ICD-10-CM | POA: Diagnosis not present

## 2020-05-06 DIAGNOSIS — N138 Other obstructive and reflux uropathy: Secondary | ICD-10-CM | POA: Diagnosis not present

## 2020-05-06 MED ORDER — TAMSULOSIN HCL 0.4 MG PO CAPS: 0.4000 mg | ORAL_CAPSULE | Freq: Every day | ORAL | 0 refills | Status: DC

## 2020-05-06 NOTE — Progress Notes (Signed)
Fill and Pull Catheter Removal  Patient is present today for a catheter removal.  Patient was cleaned and prepped in a sterile fashion 300 ml of sterile water/ saline was instilled into the bladder when the patient felt the urge to urinate.Before syring could be removed all the water came back up and was caught in the water basin.  55ml of water was then drained from the balloon.  A 20FR foley cath was removed from the bladder no complications were noted . Patient instructed to return in the morning for PVR.  Patient tolerated well.  Performed by: Tyyne Cliett, Lpn  Follow up/ Additional notes: In the morning for PVR  post void residual=478ml Patient returned unable to void and in discomfort.   Simple Catheter Placement  Due to urinary retention patient is present today for a foley cath placement.  Patient was cleaned and prepped in a sterile fashion with betadine. A 20 FR foley catheter was inserted, urine return was noted  570ml, urine was light brown in color.  The balloon was filled with 10cc of sterile water.  A leg bag was attached for drainage. Patient was also given a night bag to take home and was given instruction on how to change from one bag to another.  Patient was given instruction on proper catheter care.  Patient tolerated well, no complications were noted   Performed by: Kaimana Lurz, Lpn  Additional notes/ Follow up: next Monday for voiding trial

## 2020-05-07 ENCOUNTER — Ambulatory Visit: Payer: Medicare HMO

## 2020-05-11 ENCOUNTER — Other Ambulatory Visit: Payer: Self-pay

## 2020-05-11 ENCOUNTER — Other Ambulatory Visit: Payer: Self-pay | Admitting: Urology

## 2020-05-11 ENCOUNTER — Ambulatory Visit (INDEPENDENT_AMBULATORY_CARE_PROVIDER_SITE_OTHER): Payer: Medicare HMO

## 2020-05-11 ENCOUNTER — Telehealth: Payer: Self-pay

## 2020-05-11 DIAGNOSIS — R339 Retention of urine, unspecified: Secondary | ICD-10-CM | POA: Diagnosis not present

## 2020-05-11 LAB — URINE CULTURE

## 2020-05-11 MED ORDER — SULFAMETHOXAZOLE-TRIMETHOPRIM 800-160 MG PO TABS: 1.0000 | ORAL_TABLET | Freq: Two times a day (BID) | ORAL | 0 refills | Status: DC

## 2020-05-11 NOTE — Telephone Encounter (Signed)
-----   Message from Cleon Gustin, MD sent at 05/11/2020  9:47 AM EDT ----- Bactrim DS BID for 7 days sent to pharmacy ----- Message ----- From: Dorisann Frames, RN Sent: 05/11/2020   9:30 AM EDT To: Cleon Gustin, MD  Please review

## 2020-05-11 NOTE — Progress Notes (Signed)
Fill and Pull Catheter Removal  Patient is present today for a catheter removal.  Patient was cleaned and prepped in a sterile fashion 68ml of sterile water/ saline was instilled into the bladder when the patient felt the urge to urinate. 61ml of water was then drained from the balloon.  A 20FR foley cath was removed from the bladder no complications were noted .  Patient as then given some time to void on their own.  Patient can void  4ml on their own after some time.  Patient tolerated well.  Performed by: Estill Bamberg RN  Follow up/ Additional notes: patient will return this afternoon for PVR.      11:54am- patient returned- unable to void since leaving office.   Simple Catheter Placement  Due to urinary retention patient is present today for a foley cath placement.  Patient was cleaned and prepped in a sterile fashion with betadine. A 20 FR foley catheter was inserted, urine return was noted  631ml, urine was yellow in color.  The balloon was filled with 10cc of sterile water.  A leg bag was attached for drainage. Patient was also given a night bag to take home and was given instruction on how to change from one bag to another.  Patient was given instruction on proper catheter care.  Patient tolerated well, no complications were noted   Performed by: Estill Bamberg RN  Additional notes/ Follow up: 1 week voiding trial

## 2020-05-11 NOTE — Telephone Encounter (Signed)
Patient at office today for NV. Notified of Rx sent to pharmacy.

## 2020-05-12 NOTE — Progress Notes (Signed)
Patient seen as nurse visit.

## 2020-05-19 ENCOUNTER — Ambulatory Visit: Payer: Medicare HMO

## 2020-05-20 ENCOUNTER — Ambulatory Visit (INDEPENDENT_AMBULATORY_CARE_PROVIDER_SITE_OTHER): Payer: Medicare HMO

## 2020-05-20 ENCOUNTER — Other Ambulatory Visit: Payer: Self-pay

## 2020-05-20 DIAGNOSIS — R339 Retention of urine, unspecified: Secondary | ICD-10-CM

## 2020-05-20 NOTE — Progress Notes (Signed)
Fill and Pull Catheter Removal  Patient is present today for a catheter removal.  Patient was cleaned and prepped in a sterile fashion 250ml of sterile water/ saline was instilled into the bladder when the patient felt the urge to urinate. 65ml of water was then drained from the balloon.  A 20FR foley cath was removed from the bladder no complications were noted .  Patient as then given some time to void on their own.  Patient can void  111ml on their own after some time.  Patient tolerated well.  Performed by: Argusta Mcgann, LPN  Follow up/ Additional notes: Patient will return this afternoon for PVR   post void residual=214   Patient will f/u in am for PVR

## 2020-05-21 ENCOUNTER — Ambulatory Visit (INDEPENDENT_AMBULATORY_CARE_PROVIDER_SITE_OTHER): Payer: Medicare HMO

## 2020-05-21 DIAGNOSIS — R339 Retention of urine, unspecified: Secondary | ICD-10-CM | POA: Diagnosis not present

## 2020-05-21 NOTE — Patient Instructions (Signed)

## 2020-05-21 NOTE — Progress Notes (Signed)
post void residual=621  Cath post void residual=800  Simple Catheter Placement  Due to urinary retention patient is present today for a foley cath placement.  Patient was cleaned and prepped in a sterile fashion with betadine. A 16 FR foley catheter was inserted, urine return was noted  864ml, urine was yellow in color.  The balloon was filled with 10cc of sterile water.  A leg bag was attached for drainage. Patient was also given a night bag to take home and was given instruction on how to change from one bag to another.  Patient was given instruction on proper catheter care.  Patient tolerated well, no complications were noted   Performed by: Cara Aguino, LPN  Additional notes/ Follow up: Keep next scheduled NV

## 2020-06-22 ENCOUNTER — Ambulatory Visit (INDEPENDENT_AMBULATORY_CARE_PROVIDER_SITE_OTHER): Payer: Medicare HMO

## 2020-06-22 ENCOUNTER — Other Ambulatory Visit: Payer: Self-pay

## 2020-06-22 DIAGNOSIS — N401 Enlarged prostate with lower urinary tract symptoms: Secondary | ICD-10-CM | POA: Diagnosis not present

## 2020-06-22 DIAGNOSIS — N138 Other obstructive and reflux uropathy: Secondary | ICD-10-CM

## 2020-06-22 DIAGNOSIS — R339 Retention of urine, unspecified: Secondary | ICD-10-CM | POA: Diagnosis not present

## 2020-06-22 NOTE — Progress Notes (Signed)
Cath Change/ Replacement  Patient is present today for a catheter change due to urinary retention.  69ml of water was removed from the balloon, a 16FR foley cath was removed with out difficulty.  Patient was cleaned and prepped in a sterile fashion with betadine. A 16 FR foley cath was replaced into the bladder no complications were noted Urine return was noted 55ml and urine was yellow in color. The balloon was filled with 37ml of sterile water. A leg bag was attached for drainage.  Patient was given proper instruction on catheter care.    Performed by: Dezerae Freiberger,LPN  Follow up: Keep next scheduled OV

## 2020-06-22 NOTE — Patient Instructions (Signed)
Foley Catheter Care and Patient Education  Perform catheter care every day.  You can do this while in the shower, but NOT while taking a tub bath.  You will need the following supplies: -mild soap, such as Dove -water -a clean washcloth (not one already used for bathing) or a 4x4 piece of gauze -1 Cath-Secure -night drainage bag -2 alcohol swabs  Was you hands thoroughly with soap and water Using mild soap and water, clan your genital area Men should retract the foreskin, if needed, and clean the area, including the penis Women should separate the labia, and clean the area from front to back  3. Clean your urinary opening, which is where the catheter enters your body. 4. Clean the catheter from where it enters your body and then down, away from your body.  Hold the catheter at the point it enters your body so that you don't put tension on it. 5. Rinse the area well and dry it gently.  Changing the drainage bag You will change your drainage bag twice a day -in the morning after you shower, change he night bag to the leg bag -at night before you go to bed, change the leg bag to the night bag  Wash your hands thoroughly with soap and water Empty the urine from the drainage bag into the toilet before you change it Pinch off the catheter with your fingers and disconnect the used bag Wipe the end of the catheter using an alcohol pad Wipe the connector on the bag using the second alcohol pad Connect the clean bag to the catheter and release your finger pinch Check all connections.  Straighten any kinks or twits in the tubing  Caring for the Leg bag -always wear the leg bag below your knee.  This will help it drain. -keep the leg bag secure with the velcro straps.  If the straps leave a mark on your leg they are to tight and should be loosened.  Leaving the straps too tight can decrease you circulation and lead to blood clots. -empty the leg bag through the spout at the bottom every  2-4 hours as needed.  Do not let the bag become completely full. -do not lie down for longer than 2 hours while you are wearing the leg bag.  Caring for the Night Bag -always keep the night bag below the level of your bladder . -to hang your night bag while you sleep, place a clean plastic bag inside of a wastebasket.  Hang the night bag on the inside of the wastebasket.  Cleaning the Drainage bag Wash you hands thoroughly with soap and water. Rinse the equipment with cool water.  Do not use hot water it can damage the plastic equipment. Was the equipment with a mild liquid detergent (ivory) and rinse with cool water. To decrease odor, fill the bag halfway with a mixture of 1 part vinegar and 3 parts water. Shake the bag and let it sit for 15 minutes. Rinse the bag with cool water and hang it up to dry.  Preventing infection -keep the drainage bag below the level of your bladder and off the floor at all times. -keep the catheter secured to your thigh to prevent it from moving. -do not lie on or block the flow of urine in the tubing. -shower daily to keep the catheter clean. -clean your hands before and after touching the catheter or bag. -the spout of the drainage bag should never touch the side of   the toilet or any emptying container.  Special Points -You may see some blood or urine around where the catheter enters your body, especially when walking or having a bowel movement.  This is normal, as long as there is urine draining into the drainage bag.  If you experience significant leakage around  catheter tube where it enters your urethra possibly associated with lower abdominal cramping you could be having a bladder spasm.  Please notify your doctor and we can prescribe you a medication to improve your symptoms. -drink 1-2 glasses of liquids every 2 hours while you're awake.  Call your doctor immediately if -your catheter comes out, do not try to replace it yourself -you have  temperature of 101F (38.8C) or higher -you have decrease in the amount of urine you are making -you have foul-smelling urine -you have bright red blood or large blood clots in your urine -you have abdominal pain and no urine in your catheter bag   

## 2020-07-03 ENCOUNTER — Other Ambulatory Visit: Payer: Self-pay

## 2020-07-03 ENCOUNTER — Ambulatory Visit (INDEPENDENT_AMBULATORY_CARE_PROVIDER_SITE_OTHER): Payer: Medicare HMO

## 2020-07-03 DIAGNOSIS — R339 Retention of urine, unspecified: Secondary | ICD-10-CM

## 2020-07-03 NOTE — Progress Notes (Signed)
Patient came today with complaints of catheter no draining.  Attempted to flush and met resistance  Cath Change/ Replacement  Patient is present today for a catheter change due to urinary retention.  35m of water was removed from the balloon, a 16FR foley cath was removed with out difficulty.  Patient was cleaned and prepped in a sterile fashion with betadine. A 16 FR foley cath was replaced into the bladder no complications were noted Urine return was noted 6056mand urine was cloudy yellow in color. The balloon was filled with 1059mf sterile water. A leg bag bag was attached for drainage.  A night bag was also given to the patient and patient was given instruction on how to change from one bag to another. Patient was given proper instruction on catheter care.    Performed by: AmaEstill Bamberg  Follow up: 1 month NV  Urine sent for culture.

## 2020-07-06 LAB — URINE CULTURE

## 2020-07-07 ENCOUNTER — Telehealth: Payer: Self-pay

## 2020-07-07 MED ORDER — SULFAMETHOXAZOLE-TRIMETHOPRIM 800-160 MG PO TABS: 1.0000 | ORAL_TABLET | Freq: Two times a day (BID) | ORAL | 0 refills | Status: DC

## 2020-07-07 NOTE — Telephone Encounter (Signed)
-----   Message from Cleon Gustin, MD sent at 07/07/2020  9:49 AM EDT ----- Bactrim DS BID for 7 days ----- Message ----- From: Iris Pert, LPN Sent: 01/09/7937   8:34 AM EDT To: Cleon Gustin, MD  Patient cath due to urinary retention. Culture obtained.

## 2020-07-15 ENCOUNTER — Ambulatory Visit (INDEPENDENT_AMBULATORY_CARE_PROVIDER_SITE_OTHER): Payer: Medicare HMO | Admitting: Urology

## 2020-07-15 ENCOUNTER — Other Ambulatory Visit: Payer: Self-pay

## 2020-07-15 VITALS — BP 161/74 | HR 71

## 2020-07-15 DIAGNOSIS — N138 Other obstructive and reflux uropathy: Secondary | ICD-10-CM | POA: Diagnosis not present

## 2020-07-15 DIAGNOSIS — N401 Enlarged prostate with lower urinary tract symptoms: Secondary | ICD-10-CM | POA: Diagnosis not present

## 2020-07-15 DIAGNOSIS — N3000 Acute cystitis without hematuria: Secondary | ICD-10-CM

## 2020-07-15 DIAGNOSIS — R339 Retention of urine, unspecified: Secondary | ICD-10-CM | POA: Diagnosis not present

## 2020-07-15 MED ORDER — SULFAMETHOXAZOLE-TRIMETHOPRIM 800-160 MG PO TABS: 1.0000 | ORAL_TABLET | Freq: Two times a day (BID) | ORAL | 0 refills | Status: DC

## 2020-07-15 NOTE — Telephone Encounter (Signed)
Patient seen in office today. 

## 2020-07-15 NOTE — Progress Notes (Signed)
Urological Symptom Review  Patient is experiencing the following symptoms: Patient has catheter   Review of Systems  Gastrointestinal (upper)  : Negative for upper GI symptoms  Gastrointestinal (lower) : Negative for lower GI symptoms  Constitutional : Negative for symptoms  Skin: Negative for skin symptoms  Eyes: Negative for eye symptoms  Ear/Nose/Throat : Negative for Ear/Nose/Throat symptoms  Hematologic/Lymphatic: Negative for Hematologic/Lymphatic symptoms  Cardiovascular : Negative for cardiovascular symptoms  Respiratory : Negative for respiratory symptoms  Endocrine: Negative for endocrine symptoms  Musculoskeletal: Negative for musculoskeletal symptoms  Neurological: Negative for neurological symptoms  Psychologic: Negative for psychiatric symptoms  

## 2020-07-15 NOTE — Progress Notes (Signed)
07/15/2020 9:49 AM   Johnny Fischer 1945-11-22 [id number]  Referring provider: Neale Burly, MD [place],  [place] [address]  Urinary retention   HPI: Johnny Fischer is a 75yo here for cystoscopy. He is having thick, cloudy, foul; smelling urine draining from his foley for the past 10 days. No fevers. NO hematuria. UA is concerning for infection.    PMH: Past Medical History:  Diagnosis Date   Arthritis     Surgical History: Past Surgical History:  Procedure Laterality Date   CYSTOSCOPY N/A 05/04/2020   Procedure: CYSTOSCOPY;  Surgeon: Cleon Gustin, MD;  Location: [facility];  Service: Urology;  Laterality: N/A;   CYSTOSCOPY WITH INSERTION OF UROLIFT N/A 05/04/2020   Procedure: CYSTOSCOPY WITH INSERTION OF UROLIFT;  Surgeon: Cleon Gustin, MD;  Location: [facility];  Service: Urology;  Laterality: N/A;   JOINT REPLACEMENT     TOTAL KNEE ARTHROPLASTY Right     Home Medications:  Allergies as of 07/15/2020   No Known Allergies      Medication List        Accurate as of July 15, 2020  9:49 AM. If you have any questions, ask your nurse or doctor.          amLODipine 10 MG tablet Commonly known as: NORVASC Take 1 tablet by mouth daily.   benazepril 10 MG tablet Commonly known as: LOTENSIN Take 10 mg by mouth daily.   carvedilol 12.5 MG tablet Commonly known as: COREG Take 12.5 mg by mouth 2 (two) times daily.   Eliquis 5 MG Tabs tablet Generic drug: apixaban Take 1 tablet by mouth 2 (two) times daily.   furosemide 40 MG tablet Commonly known as: LASIX Take 40 mg by mouth daily.   losartan 100 MG tablet Commonly known as: COZAAR Take 1 tablet by mouth daily.   spironolactone 25 MG tablet Commonly known as: ALDACTONE Take 1 tablet by mouth 2 (two) times daily.   sulfamethoxazole-trimethoprim 800-160 MG tablet Commonly known as: BACTRIM DS Take 1 tablet by mouth every 12 (twelve) hours. What changed: Another medication with the  same name was removed. Continue taking this medication, and follow the directions you see here. Changed by: Nicolette Bang, MD   tamsulosin 0.4 MG Caps capsule Commonly known as: FLOMAX Take 1 capsule (0.4 mg total) by mouth daily.        Allergies: No Known Allergies  Family History: Family History  Family history unknown: Yes    Social History:  reports that he has never smoked. He has never used smokeless tobacco. He reports previous alcohol use. He reports that he does not use drugs.  ROS: All other review of systems were reviewed and are negative except what is noted above in HPI  Physical Exam: BP (!) 161/74   Pulse 71   Constitutional:  Alert and oriented, No acute distress. HEENT: [place] AT, moist mucus membranes.  Trachea midline, no masses. Cardiovascular: No clubbing, cyanosis, or edema. Respiratory: Normal respiratory effort, no increased work of breathing. GI: Abdomen is soft, nontender, nondistended, no abdominal masses GU: No CVA tenderness.  Lymph: No cervical or inguinal lymphadenopathy. Skin: No rashes, bruises or suspicious lesions. Neurologic: Grossly intact, no focal deficits, moving all 4 extremities. Psychiatric: Normal mood and affect.  Laboratory Data: Lab Results  Component Value Date   WBC 10.4 12/13/2019   HGB 13.1 12/13/2019   HCT 42.1 12/13/2019   MCV 90.7 12/13/2019   PLT 194 12/13/2019  Lab Results  Component Value Date   CREATININE 1.25 (H) 12/13/2019    Lab Results  Component Value Date   PSA 2.22 10/18/2007   PSA 1.51 05/08/2006   PSA NORMAL 03/16/2005    No results found for: TESTOSTERONE  No results found for: HGBA1C  Urinalysis    Component Value Date/Time   COLORURINE YELLOW 12/13/2019 1259   APPEARANCEUR Cloudy (A) 03/18/2020 1435   LABSPEC 1.012 12/13/2019 1259   PHURINE 5.0 12/13/2019 1259   GLUCOSEU Negative 03/18/2020 1435   HGBUR NEGATIVE 12/13/2019 1259   HGBUR negative 07/19/2007 0918   BILIRUBINUR  Negative 03/18/2020 1435   KETONESUR NEGATIVE 12/13/2019 1259   KETONESUR negative 12/13/2019 1115   PROTEINUR 1+ (A) 03/18/2020 1435   PROTEINUR NEGATIVE 12/13/2019 1259   UROBILINOGEN 0.2 12/13/2019 1115   UROBILINOGEN 1.0 07/19/2007 0918   NITRITE Negative 03/18/2020 1435   NITRITE NEGATIVE 12/13/2019 1259   LEUKOCYTESUR 2+ (A) 03/18/2020 1435   LEUKOCYTESUR NEGATIVE 12/13/2019 1259    Lab Results  Component Value Date   LABMICR See below: 03/18/2020   WBCUA >30 (A) 03/18/2020   LABEPIT 0-10 03/18/2020   BACTERIA Many (A) 03/18/2020    Pertinent Imaging:  No results found for this or any previous visit.  No results found for this or any previous visit.  No results found for this or any previous visit.  No results found for this or any previous visit.  No results found for this or any previous visit.  No results found for this or any previous visit.  No results found for this or any previous visit.  No results found for this or any previous visit.   Assessment & Plan:    1. Urinary retention -reschedule cystoscopy due to concern for UTI  2. Acute cystitis Urine for culture. We will start Bactrim DS BID for 7 days   No follow-ups on file.  Nicolette Bang, MD  [facility] [place]

## 2020-07-15 NOTE — Progress Notes (Signed)
Patient seen in office today. 

## 2020-07-22 ENCOUNTER — Encounter: Payer: Self-pay | Admitting: Urology

## 2020-07-22 ENCOUNTER — Other Ambulatory Visit: Payer: Self-pay

## 2020-07-22 ENCOUNTER — Ambulatory Visit: Payer: Medicare HMO | Admitting: Urology

## 2020-07-22 VITALS — BP 194/66 | HR 73 | Temp 98.5°F | Wt 260.8 lb

## 2020-07-22 DIAGNOSIS — R339 Retention of urine, unspecified: Secondary | ICD-10-CM | POA: Diagnosis not present

## 2020-07-22 MED ORDER — CIPROFLOXACIN HCL 500 MG PO TABS
500.0000 mg | ORAL_TABLET | Freq: Once | ORAL | Status: AC
  Administered 2020-07-22: 500 mg via ORAL

## 2020-07-22 NOTE — Progress Notes (Signed)
Surgical clearance sent to PCP

## 2020-07-22 NOTE — Progress Notes (Signed)
   07/22/20  CC:  Chief Complaint  Patient presents with   Procedure    Cysto    HPI:  Blood pressure (!) 194/66, pulse 73, temperature 98.5 F (36.9 C), temperature source Oral, weight 260 lb 12.8 oz (118.3 kg). NED. A&Ox3.   No respiratory distress   Abd soft, NT, ND Normal phallus with bilateral descended testicles  Cystoscopy Procedure Note  Patient identification was confirmed, informed consent was obtained, and patient was prepped using Betadine solution.  Lidocaine jelly was administered per urethral meatus.     Pre-Procedure: - Inspection reveals a normal caliber ureteral meatus.  Procedure: The flexible cystoscope was introduced without difficulty - No urethral strictures/lesions are present. 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 After discussing the options the patient has elected to proceed with Urolift. Risks/benefits/alternatives discussed.   Nicolette Bang, MD

## 2020-07-22 NOTE — Progress Notes (Signed)
Cath Change/ Replacement  Patient is present today for a catheter change due to urinary retention.  25ml of water was removed from the balloon, a 16FR foley cath was removed with out difficulty.  Patient was cleaned and prepped in a sterile fashion with betadine. A 16 FR foley cath was replaced into the bladder no complications were noted Urine return was noted 36ml and urine was clear in color. The balloon was filled with 28ml of sterile water. A leg bag bag was attached for drainage.  A night bag was also given to the patient and patient was given instruction on how to change from one bag to another. Patient was given proper instruction on catheter care.    Performed by: Estill Bamberg RN  Patient here today for cystoscopy. Catheter was removed prior to cysto and replaced afterwards

## 2020-07-24 ENCOUNTER — Telehealth: Payer: Self-pay

## 2020-07-24 NOTE — Progress Notes (Signed)
Pharmacy called the patient to do a medication history for his upcoming procedure and the patient reported he is not taking any medications at all. Since his list includes an anticoagulant and multiple blood pressure medications, I contacted the office of his PCP, Dr. Stoney Bang and spoke to [place] about this. He has an appointment in a few days for surgical clearance.  Romeo Rabon, PharmD Admin. Coordinator of Transitions of Care 07/24/2020,1:44 PM

## 2020-07-24 NOTE — Telephone Encounter (Signed)
Clearance note received from Dr. Sherrie Sport for patient's Urolift surgery on 07/30/20. Note states that patient is cleared to stop Eliquis 2 days prior to surgery. Called patient and notified him of this, patient verbalized understanding

## 2020-07-27 NOTE — Patient Instructions (Signed)
Johnny Fischer  07/27/2020     '@PREFPERIOPPHARMACY'$ @   Your procedure is scheduled on  07/30/2020.   Report to [facility] at  1200  P.M.   Call this number if you have problems the morning of surgery:  [phone]   Remember:  Do not eat or drink after midnight.      Take these medicines the morning of surgery with A SIP OF WATER         None.    Do not wear jewelry, make-up or nail polish.  Do not wear lotions, powders, or perfumes, or deodorant.  Do not shave 48 hours prior to surgery.  Men may shave face and neck.  Do not bring valuables to the hospital.  [facility] is not responsible for any belongings or valuables.  Contacts, dentures or bridgework may not be worn into surgery.  Leave your suitcase in the car.  After surgery it may be brought to your room.  For patients admitted to the hospital, discharge time will be determined by your treatment team.  Patients discharged the day of surgery will not be allowed to drive home and must have someone with them for 24 hours.    Special instructions:   DO NOT smoke tobacco or vape for 24 hours before your procedure.  Please read over the following fact sheets that you were given. Pain Booklet, Coughing and Deep Breathing, Surgical Site Infection Prevention, Anesthesia Post-op Instructions, and Care and Recovery After Surgery      Cystoscopy Cystoscopy is a procedure that is used to help diagnose and sometimes treat conditions that affect the lower urinary tract. The lower urinary tract includes the bladder and the urethra. The urethra is the tube that drains urine from the bladder. Cystoscopy is done using a thin, tube-shaped instrument with a light and camera at the end (cystoscope). The cystoscope may be hard or flexible, depending on the goal of theprocedure. The cystoscope is inserted through the urethra, into the bladder. Cystoscopy may be recommended if you have: Urinary tract infections that keep  coming back. Blood in the urine (hematuria). An inability to control when you urinate (urinary incontinence) or an overactive bladder. Unusual cells found in a urine sample. A blockage in the urethra, such as a urinary stone. Painful urination. An abnormality in the bladder found during an intravenous pyelogram (IVP) or CT scan. Cystoscopy may also be done to remove a sample of tissue to be examined under a microscope (biopsy). Tell a health care provider about: Any allergies you have. All medicines you are taking, including vitamins, herbs, eye drops, creams, and over-the-counter medicines. Any problems you or family members have had with anesthetic medicines. Any blood disorders you have. Any surgeries you have had. Any medical conditions you have. Whether you are pregnant or may be pregnant. What are the risks? Generally, this is a safe procedure. However, problems may occur, including: Infection. Bleeding. Allergic reactions to medicines. Damage to other structures or organs. What happens before the procedure? Medicines Ask your health care provider about: Changing or stopping your regular medicines. This is especially important if you are taking diabetes medicines or blood thinners. Taking medicines such as aspirin and ibuprofen. These medicines can thin your blood. Do not take these medicines unless your health care provider tells you to take them. Taking over-the-counter medicines, vitamins, herbs, and supplements. Tests You may have an exam or testing, such as: X-rays of the bladder, urethra,  or kidneys. CT scan of the abdomen or pelvis. Urine tests to check for signs of infection. General instructions Follow instructions from your health care provider about eating or drinking restrictions. Ask your health care provider what steps will be taken to help prevent infection. These steps may include: Washing skin with a germ-killing soap. Taking antibiotic medicine. Plan to  have a responsible adult take you home from the hospital or clinic. What happens during the procedure?  You will be given one or more of the following: A medicine to help you relax (sedative). A medicine to numb the area (local anesthetic). The area around the opening of your urethra will be cleaned. The cystoscope will be passed through your urethra into your bladder. Germ-free (sterile) fluid will flow through the cystoscope to fill your bladder. The fluid will stretch your bladder so that your health care provider can clearly examine your bladder walls. Your doctor will look at the urethra and bladder. Your doctor may take a biopsy or remove stones. The cystoscope will be removed, and your bladder will be emptied. The procedure may vary among health care providers and hospitals. What can I expect after the procedure? After the procedure, it is common to have: Some soreness or pain in your abdomen and urethra. Urinary symptoms. These include: Mild pain or burning when you urinate. Pain should stop within a few minutes after you urinate. This may last for up to 1 week. A small amount of blood in your urine for several days. Feeling like you need to urinate but producing only a small amount of urine. Follow these instructions at home: Medicines Take over-the-counter and prescription medicines only as told by your health care provider. If you were prescribed an antibiotic medicine, take it as told by your health care provider. Do not stop taking the antibiotic even if you start to feel better. General instructions Return to your normal activities as told by your health care provider. Ask your health care provider what activities are safe for you. If you were given a sedative during the procedure, it can affect you for several hours. Do not drive or operate machinery until your health care provider says that it is safe. Watch for any blood in your urine. If the amount of blood in your urine  increases, call your health care provider. Follow instructions from your health care provider about eating or drinking restrictions. If a tissue sample was removed for testing (biopsy) during your procedure, it is up to you to get your test results. Ask your health care provider, or the department that is doing the test, when your results will be ready. Drink enough fluid to keep your urine pale yellow. Keep all follow-up visits. This is important. Contact a health care provider if: You have pain that gets worse or does not get better with medicine, especially pain when you urinate. You have trouble urinating. You have more blood in your urine. Get help right away if: You have blood clots in your urine. You have abdominal pain. You have a fever or chills. You are unable to urinate. Summary Cystoscopy is a procedure that is used to help diagnose and sometimes treat conditions that affect the lower urinary tract. Cystoscopy is done using a thin, tube-shaped instrument with a light and camera at the end. After the procedure, it is common to have some soreness or pain in your abdomen and urethra. Watch for any blood in your urine. If the amount of blood in your urine  increases, call your health care provider. If you were prescribed an antibiotic medicine, take it as told by your health care provider. Do not stop taking the antibiotic even if you start to feel better. This information is not intended to replace advice given to you by your health care provider. Make sure you discuss any questions you have with your healthcare provider. Document Revised: 08/02/2019 Document Reviewed: 08/02/2019 Elsevier Patient Education  [place] Anesthesia, Adult, Care After This sheet gives you information about how to care for yourself after your procedure. Your health care provider may also give you more specific instructions. If you have problems or questions, contact your health  careprovider. What can I expect after the procedure? After the procedure, the following side effects are common: Pain or discomfort at the IV site. Nausea. Vomiting. Sore throat. Trouble concentrating. Feeling cold or chills. Feeling weak or tired. Sleepiness and fatigue. Soreness and body aches. These side effects can affect parts of the body that were not involved in surgery. Follow these instructions at home: For the time period you were told by your health care provider:  Rest. Do not participate in activities where you could fall or become injured. Do not drive or use machinery. Do not drink alcohol. Do not take sleeping pills or medicines that cause drowsiness. Do not make important decisions or sign legal documents. Do not take care of children on your own.  Eating and drinking Follow any instructions from your health care provider about eating or drinking restrictions. When you feel hungry, start by eating small amounts of foods that are soft and easy to digest (bland), such as toast. Gradually return to your regular diet. Drink enough fluid to keep your urine pale yellow. If you vomit, rehydrate by drinking water, juice, or clear broth. General instructions If you have sleep apnea, surgery and certain medicines can increase your risk for breathing problems. Follow instructions from your health care provider about wearing your sleep device: Anytime you are sleeping, including during daytime naps. While taking prescription pain medicines, sleeping medicines, or medicines that make you drowsy. Have a responsible adult stay with you for the time you are told. It is important to have someone help care for you until you are awake and alert. Return to your normal activities as told by your health care provider. Ask your health care provider what activities are safe for you. Take over-the-counter and prescription medicines only as told by your health care provider. If you smoke,  do not smoke without supervision. Keep all follow-up visits as told by your health care provider. This is important. Contact a health care provider if: You have nausea or vomiting that does not get better with medicine. You cannot eat or drink without vomiting. You have pain that does not get better with medicine. You are unable to pass urine. You develop a skin rash. You have a fever. You have redness around your IV site that gets worse. Get help right away if: You have difficulty breathing. You have chest pain. You have blood in your urine or stool, or you vomit blood. Summary After the procedure, it is common to have a sore throat or nausea. It is also common to feel tired. Have a responsible adult stay with you for the time you are told. It is important to have someone help care for you until you are awake and alert. When you feel hungry, start by eating small amounts of foods that are soft and easy  to digest (bland), such as toast. Gradually return to your regular diet. Drink enough fluid to keep your urine pale yellow. Return to your normal activities as told by your health care provider. Ask your health care provider what activities are safe for you. This information is not intended to replace advice given to you by your health care provider. Make sure you discuss any questions you have with your healthcare provider. Document Revised: 09/05/2019 Document Reviewed: 04/04/2019 Elsevier Patient Education  2022 [place]. How to Use Chlorhexidine for Bathing Chlorhexidine gluconate (CHG) is a germ-killing (antiseptic) solution that is used to clean the skin. It can get rid of the bacteria that normally live on the skin and can keep them away for about 24 hours. To clean your skin with CHG, you may be given: A CHG solution to use in the shower or as part of a sponge bath. A prepackaged cloth that contains CHG. Cleaning your skin with CHG may help lower the risk for infection: While  you are staying in the intensive care unit of the hospital. If you have a vascular access, such as a central line, to provide short-term or long-term access to your veins. If you have a catheter to drain urine from your bladder. If you are on a ventilator. A ventilator is a machine that helps you breathe by moving air in and out of your lungs. After surgery. What are the risks? Risks of using CHG include: A skin reaction. Hearing loss, if CHG gets in your ears. Eye injury, if CHG gets in your eyes and is not rinsed out. The CHG product catching fire. Make sure that you avoid smoking and flames after applying CHG to your skin. Do not use CHG: If you have a chlorhexidine allergy or have previously reacted to chlorhexidine. On babies younger than 103 months of age. How to use CHG solution Use CHG only as told by your health care provider, and follow the instructions on the label. Use the full amount of CHG as directed. Usually, this is one bottle. During a shower Follow these steps when using CHG solution during a shower (unless your health care provider gives you different instructions): Start the shower. Use your normal soap and shampoo to wash your face and hair. Turn off the shower or move out of the shower stream. Pour the CHG onto a clean washcloth. Do not use any type of brush or rough-edged sponge. Starting at your neck, lather your body down to your toes. Make sure you follow these instructions: If you will be having surgery, pay special attention to the part of your body where you will be having surgery. Scrub this area for at least 1 minute. Do not use CHG on your head or face. If the solution gets into your ears or eyes, rinse them well with water. Avoid your genital area. Avoid any areas of skin that have broken skin, cuts, or scrapes. Scrub your back and under your arms. Make sure to wash skin folds. Let the lather sit on your skin for 1-2 minutes or as long as told by your  health care provider. Thoroughly rinse your entire body in the shower. Make sure that all body creases and crevices are rinsed well. Dry off with a clean towel. Do not put any substances on your body afterward--such as powder, lotion, or perfume--unless you are told to do so by your health care provider. Only use lotions that are recommended by the manufacturer. Put on clean clothes or  pajamas. If it is the night before your surgery, sleep in clean sheets.  During a sponge bath Follow these steps when using CHG solution during a sponge bath (unless your health care provider gives you different instructions): Use your normal soap and shampoo to wash your face and hair. Pour the CHG onto a clean washcloth. Starting at your neck, lather your body down to your toes. Make sure you follow these instructions: If you will be having surgery, pay special attention to the part of your body where you will be having surgery. Scrub this area for at least 1 minute. Do not use CHG on your head or face. If the solution gets into your ears or eyes, rinse them well with water. Avoid your genital area. Avoid any areas of skin that have broken skin, cuts, or scrapes. Scrub your back and under your arms. Make sure to wash skin folds. Let the lather sit on your skin for 1-2 minutes or as long as told by your health care provider. Using a different clean, wet washcloth, thoroughly rinse your entire body. Make sure that all body creases and crevices are rinsed well. Dry off with a clean towel. Do not put any substances on your body afterward--such as powder, lotion, or perfume--unless you are told to do so by your health care provider. Only use lotions that are recommended by the manufacturer. Put on clean clothes or pajamas. If it is the night before your surgery, sleep in clean sheets. How to use CHG prepackaged cloths Only use CHG cloths as told by your health care provider, and follow the instructions on the  label. Use the CHG cloth on clean, dry skin. Do not use the CHG cloth on your head or face unless your health care provider tells you to. When washing with the CHG cloth: Avoid your genital area. Avoid any areas of skin that have broken skin, cuts, or scrapes. Before surgery Follow these steps when using a CHG cloth to clean before surgery (unless your health care provider gives you different instructions): Using the CHG cloth, vigorously scrub the part of your body where you will be having surgery. Scrub using a back-and-forth motion for 3 minutes. The area on your body should be completely wet with CHG when you are done scrubbing. Do not rinse. Discard the cloth and let the area air-dry. Do not put any substances on the area afterward, such as powder, lotion, or perfume. Put on clean clothes or pajamas. If it is the night before your surgery, sleep in clean sheets.  For general bathing Follow these steps when using CHG cloths for general bathing (unless your health care provider gives you different instructions). Use a separate CHG cloth for each area of your body. Make sure you wash between any folds of skin and between your fingers and toes. Wash your body in the following order, switching to a new cloth after each step: The front of your neck, shoulders, and chest. Both of your arms, under your arms, and your hands. Your stomach and groin area, avoiding the genitals. Your right leg and foot. Your left leg and foot. The back of your neck, your back, and your buttocks. Do not rinse. Discard the cloth and let the area air-dry. Do not put any substances on your body afterward--such as powder, lotion, or perfume--unless you are told to do so by your health care provider. Only use lotions that are recommended by the manufacturer. Put on clean clothes or pajamas. Contact a  health care provider if: Your skin gets irritated after scrubbing. You have questions about using your solution or  cloth. Get help right away if: Your eyes become very red or swollen. Your eyes itch badly. Your skin itches badly and is red or swollen. Your hearing changes. You have trouble seeing. You have swelling or tingling in your mouth or throat. You have trouble breathing. You swallow any chlorhexidine. Summary Chlorhexidine gluconate (CHG) is a germ-killing (antiseptic) solution that is used to clean the skin. Cleaning your skin with CHG may help to lower your risk for infection. You may be given CHG to use for bathing. It may be in a bottle or in a prepackaged cloth to use on your skin. Carefully follow your health care provider's instructions and the instructions on the product label. Do not use CHG if you have a chlorhexidine allergy. Contact your health care provider if your skin gets irritated after scrubbing. This information is not intended to replace advice given to you by your health care provider. Make sure you discuss any questions you have with your healthcare provider. Document Revised: 05/03/2019 Document Reviewed: 06/07/2019 Elsevier Patient Education  [place].

## 2020-07-28 ENCOUNTER — Encounter (HOSPITAL_COMMUNITY)
Admission: RE | Admit: 2020-07-28 | Discharge: 2020-07-28 | Disposition: A | Discharge status: Home / Self Care | Payer: Medicare HMO | Source: Ambulatory Visit | Attending: Urology | Admitting: Urology

## 2020-07-28 ENCOUNTER — Encounter (HOSPITAL_COMMUNITY): Payer: Self-pay

## 2020-07-28 ENCOUNTER — Telehealth: Payer: Self-pay

## 2020-07-28 DIAGNOSIS — Z1159 Encounter for screening for other viral diseases: Secondary | ICD-10-CM | POA: Diagnosis not present

## 2020-07-28 DIAGNOSIS — Z6841 Body Mass Index (BMI) 40.0 and over, adult: Secondary | ICD-10-CM | POA: Diagnosis not present

## 2020-07-28 DIAGNOSIS — I161 Hypertensive emergency: Secondary | ICD-10-CM | POA: Diagnosis not present

## 2020-07-28 NOTE — Telephone Encounter (Signed)
Received call today from PCP office, Abigail Butts. Abigail Butts stated they were not going to clear Mr. Glunt for his surgery this Thursday, July 28th.  I informed Abigail Butts we received a letter of clearance dated on 07/21 from her office. Per Abigail Butts, nurse signed off on the clearance but patient has not been taking his eliqius for 2 months per Abigail Butts.   Message sent to Dr. Alyson Ingles to advise next step.  Patient has appt with PCP next Tuesday for f/u

## 2020-07-29 NOTE — Pre-Procedure Instructions (Signed)
Patient called 07/28/2020 because he failed to show for PAT. He states that his PCP "does not want me to have surgery right now because he saw me today and my blood pressure is high." I messaged Amanda at Dr Noland Fordyce office to let her know this morning 07/29/2020.

## 2020-07-30 ENCOUNTER — Encounter: Payer: Self-pay | Admitting: Internal Medicine

## 2020-07-30 ENCOUNTER — Encounter (HOSPITAL_COMMUNITY): Admission: RE | Payer: Self-pay | Source: Home / Self Care

## 2020-07-30 ENCOUNTER — Ambulatory Visit (HOSPITAL_COMMUNITY): Admission: RE | Admit: 2020-07-30 | Payer: Medicare HMO | Source: Home / Self Care | Admitting: Urology

## 2020-07-30 SURGERY — CYSTOSCOPY WITH INSERTION OF UROLIFT
Anesthesia: General

## 2020-08-01 ENCOUNTER — Encounter: Payer: Self-pay | Admitting: Urology

## 2020-08-01 NOTE — Patient Instructions (Signed)
Benign Prostatic Hyperplasia  Benign prostatic hyperplasia (BPH) is an enlarged prostate gland that is caused by the normal aging process and not by cancer. The prostate is a walnut-sized gland that is involved in the production of semen. It is located in front of the rectum and below the bladder. The bladder stores urine and the urethra is the tube that carries the urine out of the body. The prostate may get bigger asa man gets older. An enlarged prostate can press on the urethra. This can make it harder to pass urine. The build-up of urine in the bladder can cause infection. Back pressure and infection may progress to bladder damage and kidney (renal) failure. What are the causes? This condition is part of a normal aging process. However, not all men develop problems from this condition. If the prostate enlarges away from the urethra, urine flow will not be blocked. If it enlarges toward the urethra andcompresses it, there will be problems passing urine. What increases the risk? This condition is more likely to develop in men over the age of 50 years. What are the signs or symptoms? Symptoms of this condition include: Getting up often during the night to urinate. Needing to urinate frequently during the day. Difficulty starting urine flow. Decrease in size and strength of your urine stream. Leaking (dribbling) after urinating. Inability to pass urine. This needs immediate treatment. Inability to completely empty your bladder. Pain when you pass urine. This is more common if there is also an infection. Urinary tract infection (UTI). How is this diagnosed? This condition is diagnosed based on your medical history, a physical exam, and your symptoms. Tests will also be done, such as: A post-void bladder scan. This measures any amount of urine that may remain in your bladder after you finish urinating. A digital rectal exam. In a rectal exam, your health care provider checks your prostate by  putting a lubricated, gloved finger into your rectum to feel the back of your prostate gland. This exam detects the size of your gland and any abnormal lumps or growths. An exam of your urine (urinalysis). A prostate specific antigen (PSA) screening. This is a blood test used to screen for prostate cancer. An ultrasound. This test uses sound waves to electronically produce a picture of your prostate gland. Your health care provider may refer you to a specialist in kidney and prostate diseases (urologist). How is this treated? Once symptoms begin, your health care provider will monitor your condition (active surveillance or watchful waiting). Treatment for this condition will depend on the severity of your condition. Treatment may include: Observation and yearly exams. This may be the only treatment needed if your condition and symptoms are mild. Medicines to relieve your symptoms, including: Medicines to shrink the prostate. Medicines to relax the muscle of the prostate. Surgery in severe cases. Surgery may include: Prostatectomy. In this procedure, the prostate tissue is removed completely through an open incision or with a laparoscope or robotics. Transurethral resection of the prostate (TURP). In this procedure, a tool is inserted through the opening at the tip of the penis (urethra). It is used to cut away tissue of the inner core of the prostate. The pieces are removed through the same opening of the penis. This removes the blockage. Transurethral incision (TUIP). In this procedure, small cuts are made in the prostate. This lessens the prostate's pressure on the urethra. Transurethral microwave thermotherapy (TUMT). This procedure uses microwaves to create heat. The heat destroys and removes a small   amount of prostate tissue. Transurethral needle ablation (TUNA). This procedure uses radio frequencies to destroy and remove a small amount of prostate tissue. Interstitial laser coagulation (ILC).  This procedure uses a laser to destroy and remove a small amount of prostate tissue. Transurethral electrovaporization (TUVP). This procedure uses electrodes to destroy and remove a small amount of prostate tissue. Prostatic urethral lift. This procedure inserts an implant to push the lobes of the prostate away from the urethra. Follow these instructions at home: Take over-the-counter and prescription medicines only as told by your health care provider. Monitor your symptoms for any changes. Contact your health care provider with any changes. Avoid drinking large amounts of liquid before going to bed or out in public. Avoid or reduce how much caffeine or alcohol you drink. Give yourself time when you urinate. Keep all follow-up visits as told by your health care provider. This is important. Contact a health care provider if: You have unexplained back pain. Your symptoms do not get better with treatment. You develop side effects from the medicine you are taking. Your urine becomes very dark or has a bad smell. Your lower abdomen becomes distended and you have trouble passing your urine. Get help right away if: You have a fever or chills. You suddenly cannot urinate. You feel lightheaded, or very dizzy, or you faint. There are large amounts of blood or clots in the urine. Your urinary problems become hard to manage. You develop moderate to severe low back or flank pain. The flank is the side of your body between the ribs and the hip. These symptoms may represent a serious problem that is an emergency. Do not wait to see if the symptoms will go away. Get medical help right away. Call your local emergency services (911 in the [place] Do not drive yourself to the hospital. Summary Benign prostatic hyperplasia (BPH) is an enlarged prostate that is caused by the normal aging process and not by cancer. An enlarged prostate can press on the urethra. This can make it hard to pass urine. This  condition is part of a normal aging process and is more likely to develop in men over the age of 50 years. Get help right away if you suddenly cannot urinate. This information is not intended to replace advice given to you by your health care provider. Make sure you discuss any questions you have with your healthcare provider. Document Revised: 08/29/2019 Document Reviewed: 08/29/2019 Elsevier Patient Education  2022 [employer] Inc.  

## 2020-08-01 NOTE — Patient Instructions (Signed)
Urinary Tract Infection, Adult °A urinary tract infection (UTI) is an infection of any part of the urinary tract. The urinary tract includes: °The kidneys. °The ureters. °The bladder. °The urethra. °These organs make, store, and get rid of pee (urine) in the body. °What are the causes? °This infection is caused by germs (bacteria) in your genital area. These germs grow and cause swelling (inflammation) of your urinary tract. °What increases the risk? °The following factors may make you more likely to develop this condition: °Using a small, thin tube (catheter) to drain pee. °Not being able to control when you pee or poop (incontinence). °Being male. If you are male, these things can increase the risk: °Using these methods to prevent pregnancy: °A medicine that kills sperm (spermicide). °A device that blocks sperm (diaphragm). °Having low levels of a male hormone (estrogen). °Being pregnant. °You are more likely to develop this condition if: °You have genes that add to your risk. °You are sexually active. °You take antibiotic medicines. °You have trouble peeing because of: °A prostate that is bigger than normal, if you are male. °A blockage in the part of your body that drains pee from the bladder. °A kidney stone. °A nerve condition that affects your bladder. °Not getting enough to drink. °Not peeing often enough. °You have other conditions, such as: °Diabetes. °A weak disease-fighting system (immune system). °Sickle cell disease. °Gout. °Injury of the spine. °What are the signs or symptoms? °Symptoms of this condition include: °Needing to pee right away. °Peeing small amounts often. °Pain or burning when peeing. °Blood in the pee. °Pee that smells bad or not like normal. °Trouble peeing. °Pee that is cloudy. °Fluid coming from the vagina, if you are male. °Pain in the belly or lower back. °Other symptoms include: °Vomiting. °Not feeling hungry. °Feeling mixed up (confused). This may be the first symptom in  older adults. °Being tired and grouchy (irritable). °A fever. °Watery poop (diarrhea). °How is this treated? °Taking antibiotic medicine. °Taking other medicines. °Drinking enough water. °In some cases, you may need to see a specialist. °Follow these instructions at home: °Medicines °Take over-the-counter and prescription medicines only as told by your doctor. °If you were prescribed an antibiotic medicine, take it as told by your doctor. Do not stop taking it even if you start to feel better. °General instructions °Make sure you: °Pee until your bladder is empty. °Do not hold pee for a long time. °Empty your bladder after sex. °Wipe from front to back after peeing or pooping if you are a male. Use each tissue one time when you wipe. °Drink enough fluid to keep your pee pale yellow. °Keep all follow-up visits. °Contact a doctor if: °You do not get better after 1-2 days. °Your symptoms go away and then come back. °Get help right away if: °You have very bad back pain. °You have very bad pain in your lower belly. °You have a fever. °You have chills. °You feeling like you will vomit or you vomit. °Summary °A urinary tract infection (UTI) is an infection of any part of the urinary tract. °This condition is caused by germs in your genital area. °There are many risk factors for a UTI. °Treatment includes antibiotic medicines. °Drink enough fluid to keep your pee pale yellow. °This information is not intended to replace advice given to you by your health care provider. Make sure you discuss any questions you have with your health care provider. °Document Revised: 08/02/2019 Document Reviewed: 08/02/2019 °Elsevier Patient Education © 2022   Elsevier Inc. ° °

## 2020-08-03 ENCOUNTER — Ambulatory Visit: Payer: Medicare HMO

## 2020-08-04 ENCOUNTER — Ambulatory Visit: Payer: Medicare HMO

## 2020-08-04 DIAGNOSIS — Z6841 Body Mass Index (BMI) 40.0 and over, adult: Secondary | ICD-10-CM | POA: Diagnosis not present

## 2020-08-04 DIAGNOSIS — I10 Essential (primary) hypertension: Secondary | ICD-10-CM | POA: Diagnosis not present

## 2020-08-05 NOTE — Telephone Encounter (Signed)
Rescheduled surgery per Dr. Alyson Ingles okay for August 29th.

## 2020-08-10 ENCOUNTER — Ambulatory Visit: Payer: Medicare HMO | Admitting: Urology

## 2020-08-25 NOTE — Patient Instructions (Addendum)
Johnny Fischer  08/25/2020     '@PREFPERIOPPHARMACY'$ @   Your procedure is scheduled on 08/31/20.  Report to [facility] at 0900 A.M.  Call this number if you have problems the morning of surgery:  [phone]   Remember:  Do not eat or drink after midnight.        Take these medicines the morning of surgery with A SIP OF WATER amlodopine, coreg, losartan & flomax. Last dose of Eliquis needs to be 08/29/20.    Do not wear jewelry, make-up or nail polish.  Do not wear lotions, powders, or perfumes, or deodorant.  Do not shave 48 hours prior to surgery.  Men may shave face and neck.  Do not bring valuables to the hospital.  [facility] is not responsible for any belongings or valuables.  Contacts, dentures or bridgework may not be worn into surgery.  Leave your suitcase in the car.  After surgery it may be brought to your room.  For patients admitted to the hospital, discharge time will be determined by your treatment team.  Patients discharged the day of surgery will not be allowed to drive home.   Name and phone number of your driver:   family Special instructions:  no smoking or vaping for 24 hours prior to procedure  Please read over the following fact sheets that you were given. Surgical Site Infection Prevention, Anesthesia Post-op Instructions, and Care and Recovery After Surgery      PATIENT INSTRUCTIONS POST-ANESTHESIA  IMMEDIATELY FOLLOWING SURGERY:  Do not drive or operate machinery for the first twenty four hours after surgery.  Do not make any important decisions for twenty four hours after surgery or while taking narcotic pain medications or sedatives.  If you develop intractable nausea and vomiting or a severe headache please notify your doctor immediately.  FOLLOW-UP:  Please make an appointment with your surgeon as instructed. You do not need to follow up with anesthesia unless specifically instructed to do so.  WOUND CARE INSTRUCTIONS (if applicable):  Keep  a dry clean dressing on the anesthesia/puncture wound site if there is drainage.  Once the wound has quit draining you may leave it open to air.  Generally you should leave the bandage intact for twenty four hours unless there is drainage.  If the epidural site drains for more than 36-48 hours please call the anesthesia department.  QUESTIONS?:  Please feel free to call your physician or the hospital operator if you have any questions, and they will be happy to assist you.      General Anesthesia, Adult, Care After This sheet gives you information about how to care for yourself after your procedure. Your health care provider may also give you more specific instructions. If you have problems or questions, contact your health careprovider. What can I expect after the procedure? After the procedure, the following side effects are common: Pain or discomfort at the IV site. Nausea. Vomiting. Sore throat. Trouble concentrating. Feeling cold or chills. Feeling weak or tired. Sleepiness and fatigue. Soreness and body aches. These side effects can affect parts of the body that were not involved in surgery. Follow these instructions at home: For the time period you were told by your health care provider:  Rest. Do not participate in activities where you could fall or become injured. Do not drive or use machinery. Do not drink alcohol. Do not take sleeping pills or medicines that cause drowsiness. Do not make important decisions or sign legal documents.  Do not take care of children on your own.  Eating and drinking Follow any instructions from your health care provider about eating or drinking restrictions. When you feel hungry, start by eating small amounts of foods that are soft and easy to digest (bland), such as toast. Gradually return to your regular diet. Drink enough fluid to keep your urine pale yellow. If you vomit, rehydrate by drinking water, juice, or clear broth. General  instructions If you have sleep apnea, surgery and certain medicines can increase your risk for breathing problems. Follow instructions from your health care provider about wearing your sleep device: Anytime you are sleeping, including during daytime naps. While taking prescription pain medicines, sleeping medicines, or medicines that make you drowsy. Have a responsible adult stay with you for the time you are told. It is important to have someone help care for you until you are awake and alert. Return to your normal activities as told by your health care provider. Ask your health care provider what activities are safe for you. Take over-the-counter and prescription medicines only as told by your health care provider. If you smoke, do not smoke without supervision. Keep all follow-up visits as told by your health care provider. This is important. Contact a health care provider if: You have nausea or vomiting that does not get better with medicine. You cannot eat or drink without vomiting. You have pain that does not get better with medicine. You are unable to pass urine. You develop a skin rash. You have a fever. You have redness around your IV site that gets worse. Get help right away if: You have difficulty breathing. You have chest pain. You have blood in your urine or stool, or you vomit blood. Summary After the procedure, it is common to have a sore throat or nausea. It is also common to feel tired. Have a responsible adult stay with you for the time you are told. It is important to have someone help care for you until you are awake and alert. When you feel hungry, start by eating small amounts of foods that are soft and easy to digest (bland), such as toast. Gradually return to your regular diet. Drink enough fluid to keep your urine pale yellow. Return to your normal activities as told by your health care provider. Ask your health care provider what activities are safe for you. This  information is not intended to replace advice given to you by your health care provider. Make sure you discuss any questions you have with your healthcare provider. Document Revised: 09/05/2019 Document Reviewed: 04/04/2019 Elsevier Patient Education  2022 [place]. Cystoscopy Cystoscopy is a procedure that is used to help diagnose and sometimes treat conditions that affect the lower urinary tract. The lower urinary tract includes the bladder and the urethra. The urethra is the tube that drains urine from the bladder. Cystoscopy is done using a thin, tube-shaped instrument with a light and camera at the end (cystoscope). The cystoscope may be hard or flexible, depending on the goal of theprocedure. The cystoscope is inserted through the urethra, into the bladder. Cystoscopy may be recommended if you have: Urinary tract infections that keep coming back. Blood in the urine (hematuria). An inability to control when you urinate (urinary incontinence) or an overactive bladder. Unusual cells found in a urine sample. A blockage in the urethra, such as a urinary stone. Painful urination. An abnormality in the bladder found during an intravenous pyelogram (IVP) or CT scan. Cystoscopy may also  be done to remove a sample of tissue to be examined under a microscope (biopsy). Tell a health care provider about: Any allergies you have. All medicines you are taking, including vitamins, herbs, eye drops, creams, and over-the-counter medicines. Any problems you or family members have had with anesthetic medicines. Any blood disorders you have. Any surgeries you have had. Any medical conditions you have. Whether you are pregnant or may be pregnant. What are the risks? Generally, this is a safe procedure. However, problems may occur, including: Infection. Bleeding. Allergic reactions to medicines. Damage to other structures or organs. What happens before the procedure? Medicines Ask your health care  provider about: Changing or stopping your regular medicines. This is especially important if you are taking diabetes medicines or blood thinners. Taking medicines such as aspirin and ibuprofen. These medicines can thin your blood. Do not take these medicines unless your health care provider tells you to take them. Taking over-the-counter medicines, vitamins, herbs, and supplements. Tests You may have an exam or testing, such as: X-rays of the bladder, urethra, or kidneys. CT scan of the abdomen or pelvis. Urine tests to check for signs of infection. General instructions Follow instructions from your health care provider about eating or drinking restrictions. Ask your health care provider what steps will be taken to help prevent infection. These steps may include: Washing skin with a germ-killing soap. Taking antibiotic medicine. Plan to have a responsible adult take you home from the hospital or clinic. What happens during the procedure?  You will be given one or more of the following: A medicine to help you relax (sedative). A medicine to numb the area (local anesthetic). The area around the opening of your urethra will be cleaned. The cystoscope will be passed through your urethra into your bladder. Germ-free (sterile) fluid will flow through the cystoscope to fill your bladder. The fluid will stretch your bladder so that your health care provider can clearly examine your bladder walls. Your doctor will look at the urethra and bladder. Your doctor may take a biopsy or remove stones. The cystoscope will be removed, and your bladder will be emptied. The procedure may vary among health care providers and hospitals. What can I expect after the procedure? After the procedure, it is common to have: Some soreness or pain in your abdomen and urethra. Urinary symptoms. These include: Mild pain or burning when you urinate. Pain should stop within a few minutes after you urinate. This may last  for up to 1 week. A small amount of blood in your urine for several days. Feeling like you need to urinate but producing only a small amount of urine. Follow these instructions at home: Medicines Take over-the-counter and prescription medicines only as told by your health care provider. If you were prescribed an antibiotic medicine, take it as told by your health care provider. Do not stop taking the antibiotic even if you start to feel better. General instructions Return to your normal activities as told by your health care provider. Ask your health care provider what activities are safe for you. If you were given a sedative during the procedure, it can affect you for several hours. Do not drive or operate machinery until your health care provider says that it is safe. Watch for any blood in your urine. If the amount of blood in your urine increases, call your health care provider. Follow instructions from your health care provider about eating or drinking restrictions. If a tissue sample was removed for  testing (biopsy) during your procedure, it is up to you to get your test results. Ask your health care provider, or the department that is doing the test, when your results will be ready. Drink enough fluid to keep your urine pale yellow. Keep all follow-up visits. This is important. Contact a health care provider if: You have pain that gets worse or does not get better with medicine, especially pain when you urinate. You have trouble urinating. You have more blood in your urine. Get help right away if: You have blood clots in your urine. You have abdominal pain. You have a fever or chills. You are unable to urinate. Summary Cystoscopy is a procedure that is used to help diagnose and sometimes treat conditions that affect the lower urinary tract. Cystoscopy is done using a thin, tube-shaped instrument with a light and camera at the end. After the procedure, it is common to have some  soreness or pain in your abdomen and urethra. Watch for any blood in your urine. If the amount of blood in your urine increases, call your health care provider. If you were prescribed an antibiotic medicine, take it as told by your health care provider. Do not stop taking the antibiotic even if you start to feel better. This information is not intended to replace advice given to you by your health care provider. Make sure you discuss any questions you have with your healthcare provider. Document Revised: 08/02/2019 Document Reviewed: 08/02/2019 Elsevier Patient Education  [place].

## 2020-08-26 ENCOUNTER — Encounter (HOSPITAL_COMMUNITY)
Admission: RE | Admit: 2020-08-26 | Discharge: 2020-08-26 | Disposition: A | Discharge status: Home / Self Care | Payer: Medicare HMO | Source: Ambulatory Visit | Attending: Urology | Admitting: Urology

## 2020-08-26 ENCOUNTER — Encounter (HOSPITAL_COMMUNITY): Payer: Self-pay | Admitting: Anesthesiology

## 2020-08-26 ENCOUNTER — Encounter (HOSPITAL_COMMUNITY): Payer: Self-pay

## 2020-08-26 ENCOUNTER — Other Ambulatory Visit: Payer: Self-pay

## 2020-08-26 DIAGNOSIS — Z01818 Encounter for other preprocedural examination: Secondary | ICD-10-CM | POA: Insufficient documentation

## 2020-08-26 HISTORY — DX: Essential (primary) hypertension: I10

## 2020-08-26 LAB — CBC
HCT: 35.8 % — ABNORMAL LOW (ref 39.0–52.0)
Hemoglobin: 11.2 g/dL — ABNORMAL LOW (ref 13.0–17.0)
MCH: 28.8 pg (ref 26.0–34.0)
MCHC: 31.3 g/dL (ref 30.0–36.0)
MCV: 92 fL (ref 80.0–100.0)
Platelets: 287 10*3/uL (ref 150–400)
RBC: 3.89 MIL/uL — ABNORMAL LOW (ref 4.22–5.81)
RDW: 13.3 % (ref 11.5–15.5)
WBC: 7.4 10*3/uL (ref 4.0–10.5)
nRBC: 0 % (ref 0.0–0.2)

## 2020-08-26 LAB — BASIC METABOLIC PANEL
Anion gap: 6 (ref 5–15)
BUN: 16 mg/dL (ref 8–23)
CO2: 25 mmol/L (ref 22–32)
Calcium: 9.1 mg/dL (ref 8.9–10.3)
Chloride: 108 mmol/L (ref 98–111)
Creatinine, Ser: 1.17 mg/dL (ref 0.61–1.24)
GFR, Estimated: >60 mL/min (ref >60)
Glucose, Bld: 79 mg/dL (ref 70–99)
Potassium: 4 mmol/L (ref 3.5–5.1)
Sodium: 139 mmol/L (ref 135–145)

## 2020-08-31 ENCOUNTER — Ambulatory Visit (HOSPITAL_COMMUNITY): Admission: RE | Admit: 2020-08-31 | Payer: Medicare HMO | Source: Home / Self Care | Admitting: Urology

## 2020-08-31 ENCOUNTER — Encounter (HOSPITAL_COMMUNITY): Admission: RE | Payer: Self-pay | Source: Home / Self Care

## 2020-08-31 SURGERY — CYSTOSCOPY WITH INSERTION OF UROLIFT
Anesthesia: General

## 2020-09-02 ENCOUNTER — Ambulatory Visit: Payer: Medicare HMO | Admitting: Urology

## 2020-09-02 ENCOUNTER — Ambulatory Visit (INDEPENDENT_AMBULATORY_CARE_PROVIDER_SITE_OTHER): Payer: Medicare HMO

## 2020-09-02 ENCOUNTER — Other Ambulatory Visit: Payer: Self-pay

## 2020-09-02 DIAGNOSIS — R339 Retention of urine, unspecified: Secondary | ICD-10-CM

## 2020-09-02 NOTE — Progress Notes (Signed)
Cath Change/ Replacement  Patient is present today for a catheter change due to urinary retention.  50m of water was removed from the balloon, a 16FR foley cath was removed with out difficulty.  Patient was cleaned and prepped in a sterile fashion with betadine. A 16 FR foley cath was replaced into the bladder no complications were noted Urine return was noted 3083mand urine was yellow in color. The balloon was filled with 1034mf sterile water. A leg bag was attached for drainage.  A night bag was also given to the patient and patient was given instruction on how to change from one bag to another. Patient was given proper instruction on catheter care.    Performed by: AmaEstill Bamberg  Follow up: waiting for surgery.  Patient reported  he noticed Monday his catheter did not seem to drain well. No urine in bag upon arrival to office. New catheter placed. Reviewed with Dr. McKAlyson Ingleso antibiotic needs

## 2020-09-15 ENCOUNTER — Telehealth: Payer: Self-pay

## 2020-09-15 NOTE — Telephone Encounter (Signed)
Surgical clearance letter printed and faxed to PCP.

## 2020-09-15 NOTE — Telephone Encounter (Signed)
-----   Message from Jacqulynn Cadet, RN sent at 08/28/2020  1:15 PM EDT ----- Regarding: Eliquis Hold Johnny Fischer has not been taking his Eliquis. He was seen in the office by Dr Armandina Stammer  this week and medications were reviewed with him and how to take them.  He has restarted his Eliquis this past Wednesday per office staff.  He will continue taking it until its time for his procedure.  He will then be able to hold the Eliquis for 48 hours prior to the procedure.  Dr Charna Elizabeth wants the patient to take the Eliquis at least for one week prior to rescheduling his procedure. He has been cancelled for now. Thank you!

## 2020-09-17 ENCOUNTER — Ambulatory Visit (INDEPENDENT_AMBULATORY_CARE_PROVIDER_SITE_OTHER): Payer: Medicare HMO

## 2020-09-17 ENCOUNTER — Other Ambulatory Visit: Payer: Self-pay

## 2020-09-17 DIAGNOSIS — R339 Retention of urine, unspecified: Secondary | ICD-10-CM | POA: Diagnosis not present

## 2020-09-17 DIAGNOSIS — I10 Essential (primary) hypertension: Secondary | ICD-10-CM | POA: Diagnosis not present

## 2020-09-17 DIAGNOSIS — Z6841 Body Mass Index (BMI) 40.0 and over, adult: Secondary | ICD-10-CM | POA: Diagnosis not present

## 2020-09-17 DIAGNOSIS — I2699 Other pulmonary embolism without acute cor pulmonale: Secondary | ICD-10-CM | POA: Diagnosis not present

## 2020-09-17 NOTE — Progress Notes (Signed)
Patient present today with none draining catheter. Unable to unclogged catheter.   Cath Change/ Replacement  Patient is present today for a catheter change due to urinary retention.  44m of water was removed from the balloon, a 16FR foley cath was removed with out difficulty.  Patient was cleaned and prepped in a sterile fashion with betadine. A 16 FR foley cath was replaced into the bladder no complications were noted Urine return was noted 4029mand urine was turbid in color. The balloon was filled with 1019mf sterile water. A leg bag was attached for drainage. Patient was given proper instruction on catheter care.    Performed by: Adalei Novell LPN  Follow up: Keep next scheduled post op date  Urine sent for culture

## 2020-09-19 LAB — URINE CULTURE

## 2020-09-24 ENCOUNTER — Encounter (HOSPITAL_COMMUNITY): Payer: Self-pay

## 2020-09-24 ENCOUNTER — Other Ambulatory Visit: Payer: Self-pay

## 2020-09-24 ENCOUNTER — Emergency Department (HOSPITAL_COMMUNITY)
Admission: EM | Admit: 2020-09-24 | Discharge: 2020-09-25 | Disposition: A | Discharge status: Home / Self Care | Payer: Medicare HMO | Attending: Emergency Medicine | Admitting: Emergency Medicine

## 2020-09-24 DIAGNOSIS — N390 Urinary tract infection, site not specified: Secondary | ICD-10-CM

## 2020-09-24 DIAGNOSIS — Y846 Urinary catheterization as the cause of abnormal reaction of the patient, or of later complication, without mention of misadventure at the time of the procedure: Secondary | ICD-10-CM | POA: Diagnosis not present

## 2020-09-24 DIAGNOSIS — T83511A Infection and inflammatory reaction due to indwelling urethral catheter, initial encounter: Secondary | ICD-10-CM | POA: Diagnosis not present

## 2020-09-24 DIAGNOSIS — I10 Essential (primary) hypertension: Secondary | ICD-10-CM | POA: Diagnosis not present

## 2020-09-24 DIAGNOSIS — Z96651 Presence of right artificial knee joint: Secondary | ICD-10-CM | POA: Insufficient documentation

## 2020-09-24 DIAGNOSIS — T83091A Other mechanical complication of indwelling urethral catheter, initial encounter: Secondary | ICD-10-CM | POA: Insufficient documentation

## 2020-09-24 DIAGNOSIS — Z7901 Long term (current) use of anticoagulants: Secondary | ICD-10-CM | POA: Diagnosis not present

## 2020-09-24 DIAGNOSIS — Z79899 Other long term (current) drug therapy: Secondary | ICD-10-CM | POA: Insufficient documentation

## 2020-09-24 DIAGNOSIS — T839XXA Unspecified complication of genitourinary prosthetic device, implant and graft, initial encounter: Secondary | ICD-10-CM

## 2020-09-24 DIAGNOSIS — T83098A Other mechanical complication of other indwelling urethral catheter, initial encounter: Secondary | ICD-10-CM | POA: Diagnosis not present

## 2020-09-24 NOTE — ED Triage Notes (Signed)
Pt says he has had foley cath for about a year, last changed 2 weeks ago, pt says it is not draining urine, started today.

## 2020-09-25 LAB — URINALYSIS, ROUTINE W REFLEX MICROSCOPIC
Bilirubin Urine: NEGATIVE
Glucose, UA: NEGATIVE mg/dL
Ketones, ur: NEGATIVE mg/dL
Nitrite: POSITIVE — AB
Protein, ur: 100 mg/dL — AB
Specific Gravity, Urine: 1.01 (ref 1.005–1.030)
pH: >9 — ABNORMAL HIGH (ref 5.0–8.0)

## 2020-09-25 LAB — URINALYSIS, MICROSCOPIC (REFLEX)
RBC / HPF: >50 RBC/hpf (ref 0–5)
WBC, UA: >50 WBC/hpf (ref 0–5)

## 2020-09-25 MED ORDER — CEPHALEXIN 500 MG PO CAPS: 500.0000 mg | ORAL_CAPSULE | Freq: Three times a day (TID) | ORAL | 0 refills | Status: DC

## 2020-09-25 MED ORDER — CEFTRIAXONE SODIUM 1 G IJ SOLR
1.0000 g | Freq: Once | INTRAMUSCULAR | Status: AC
  Administered 2020-09-25: 1 g via INTRAMUSCULAR
  Filled 2020-09-25: qty 10

## 2020-09-25 MED ORDER — ACETAMINOPHEN 500 MG PO TABS
1000.0000 mg | ORAL_TABLET | Freq: Once | ORAL | Status: AC
  Administered 2020-09-25: 1000 mg via ORAL
  Filled 2020-09-25: qty 2

## 2020-09-25 NOTE — ED Notes (Signed)
  Catheter switched from a large drainage bag to leg bag.  Patient instructed on how to empty bag.

## 2020-09-25 NOTE — ED Provider Notes (Signed)
[facility] EMERGENCY DEPARTMENT Provider Note   CSN: [id number] Arrival date & time: 09/24/20  1857     History Chief Complaint  Patient presents with   foley cath problem    Johnny Fischer is a 75 y.o. male.  Patient is a 75 year old male with past medical history of hypertension, arthritis, DVTs, and BPH.  Patient presenting today for evaluation blocked Foley catheter.  Patient has had no urine output since this morning.  He feels distention and discomfort to his lower abdomen.  He denies any fevers or chills.  The history is provided by the patient.      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   HYPERGLYCEMIA, FASTING 11/14/2007   OTHER NONSPECIFIC FINDINGS EXAMINATION OF BLOOD 11/14/2007   KNEE PAIN, LEFT 11/24/2006   WRIST PAIN 11/10/2006   GERD, SEVERE 06/15/2006   HYPERLIPIDEMIA 12/07/2005   OBESITY NOS 12/07/2005   HYPERTENSION 38/25/0539   HERNIA, UMBILICAL 76/73/4193   CALCULUS, KIDNEY 12/07/2005   OSTEOARTHRITIS 12/07/2005   DVT, HX OF 12/07/2005    Past Surgical History:  Procedure Laterality Date   CYSTOSCOPY N/A 05/04/2020   Procedure: CYSTOSCOPY;  Surgeon: Cleon Gustin, MD;  Location: [facility];  Service: Urology;  Laterality: N/A;   CYSTOSCOPY WITH INSERTION OF UROLIFT N/A 05/04/2020   Procedure: CYSTOSCOPY WITH INSERTION OF UROLIFT;  Surgeon: Cleon Gustin, MD;  Location: [facility];  Service: Urology;  Laterality: N/A;   JOINT REPLACEMENT     TOTAL KNEE ARTHROPLASTY Right        Family History  Family history unknown: Yes    Social History   Tobacco Use   Smoking status: Never   Smokeless tobacco: Never  Vaping Use    Vaping Use: Never used  Substance Use Topics   Alcohol use: Not Currently   Drug use: Never    Home Medications Prior to Admission medications   Medication Sig Start Date End Date Taking? Authorizing Provider  amLODipine (NORVASC) 10 MG tablet Take 10 mg by mouth daily. Patient not taking: No sig reported 04/28/20   [provider]  benazepril (LOTENSIN) 10 MG tablet Take 10 mg by mouth daily. Patient not taking: No sig reported 01/08/20   [provider]  carvedilol (COREG) 12.5 MG tablet Take 12.5 mg by mouth 2 (two) times daily. Patient not taking: No sig reported 01/08/20   [provider]  ELIQUIS 5 MG TABS tablet Take 5 mg by mouth 2 (two) times daily. Patient not taking: No sig reported 01/28/20   [provider]  furosemide (LASIX) 40 MG tablet Take 40 mg by mouth daily. Patient not taking: No sig reported 01/08/20   [provider]  losartan (COZAAR) 100 MG tablet Take 100 mg by mouth daily. Patient not taking: No sig reported 04/28/20   [provider]  olmesartan (BENICAR) 40 MG tablet Take 40 mg by mouth daily.    [provider]  spironolactone (ALDACTONE) 25 MG tablet Take 25 mg by mouth 2 (two) times daily. Patient not taking: No sig reported 01/08/20   [provider]  sulfamethoxazole-trimethoprim (BACTRIM DS) 800-160 MG tablet Take 1 tablet by mouth  every 12 (twelve) hours. Patient not taking: No sig reported 07/15/20   Cleon Gustin, MD  tamsulosin (FLOMAX) 0.4 MG CAPS capsule Take 1 capsule (0.4 mg total) by mouth daily. Patient taking differently: Take 0.8 mg by mouth daily. 05/06/20   McKenzie, Candee Furbish, MD    Allergies    Patient has no known allergies.  Review of Systems   Review of Systems  All other systems reviewed and are negative.  Physical Exam Updated Vital Signs BP (!) 165/63   Pulse 86   Temp 97.6 F (36.4 C)   Resp 20   Ht 5\' 9"  (1.753 m)   Wt 116.1 kg   SpO2 97%   BMI  37.80 kg/m   Physical Exam Vitals and nursing note reviewed.  Constitutional:      General: He is not in acute distress.    Appearance: Normal appearance. He is not ill-appearing.  HENT:     Head: Normocephalic and atraumatic.  Pulmonary:     Effort: Pulmonary effort is normal.  Abdominal:     General: There is no distension.     Palpations: Abdomen is soft.     Tenderness: There is abdominal tenderness. There is no right CVA tenderness or left CVA tenderness.     Comments: There is mild suprapubic tenderness and fullness.  There is no rebound or guarding.  Skin:    General: Skin is warm and dry.  Neurological:     Mental Status: He is alert and oriented to person, place, and time.    ED Results / Procedures / Treatments   Labs (all labs ordered are listed, but only abnormal results are displayed) Labs Reviewed  URINALYSIS, ROUTINE W REFLEX MICROSCOPIC    EKG None  Radiology No results found.  Procedures Procedures   Medications Ordered in ED Medications - No data to display  ED Course  I have reviewed the triage vital signs and the nursing notes.  Pertinent labs & imaging results that were available during my care of the patient were reviewed by me and considered in my medical decision making (see chart for details).    MDM Rules/Calculators/A&P  Patient presenting with complaints of clogged Foley catheter.  He has had no output since this morning.  Patient does have a fever here of 100.7, but is nontoxic in appearance.  Urine suggestive of a UTI after the Foley catheter was changed.  Patient given Rocephin and will be discharged with Keflex and urology follow-up.  Final Clinical Impression(s) / ED Diagnoses Final diagnoses:  None    Rx / DC Orders ED Discharge Orders     None        Veryl Speak, MD 09/25/20 [phone]

## 2020-09-25 NOTE — Discharge Instructions (Addendum)
Begin taking Keflex as prescribed.  Follow-up with your urologist in the next week, and return to the ER if you develop worsening pain, high fevers, or other new and concerning symptoms.

## 2020-09-29 DIAGNOSIS — I2699 Other pulmonary embolism without acute cor pulmonale: Secondary | ICD-10-CM | POA: Diagnosis not present

## 2020-09-29 DIAGNOSIS — I10 Essential (primary) hypertension: Secondary | ICD-10-CM | POA: Diagnosis not present

## 2020-09-29 DIAGNOSIS — Z6841 Body Mass Index (BMI) 40.0 and over, adult: Secondary | ICD-10-CM | POA: Diagnosis not present

## 2020-09-29 DIAGNOSIS — N400 Enlarged prostate without lower urinary tract symptoms: Secondary | ICD-10-CM | POA: Diagnosis not present

## 2020-09-29 NOTE — Progress Notes (Signed)
Letter resent today. Per [facility] office, has not received.

## 2020-09-30 ENCOUNTER — Telehealth: Payer: Self-pay

## 2020-09-30 NOTE — Telephone Encounter (Signed)
Patient called and reviewed with patient Eliquis hold 2 days prior to surgery. Patient voiced understanding to hold Eliquis starting this Saturday, October 1st.

## 2020-10-01 NOTE — Patient Instructions (Signed)
Johnny Fischer  10/01/2020     @PREFPERIOPPHARMACY @   Your procedure is scheduled on  10/05/2020.   Report to [facility] at  1100  A.M.    Call this number if you have problems the morning of surgery:  [phone]   Remember:  Do not eat or drink after midnight.    Your last dose of eliquis should be 10/02/2020.      Take these medicines the morning of surgery with A SIP OF WATER                          None.     Do not wear jewelry, make-up or nail polish.  Do not wear lotions, powders, or perfumes, or deodorant.  Do not shave 48 hours prior to surgery.  Men may shave face and neck.  Do not bring valuables to the hospital.  [facility] is not responsible for any belongings or valuables.  Contacts, dentures or bridgework may not be worn into surgery.  Leave your suitcase in the car.  After surgery it may be brought to your room.  For patients admitted to the hospital, discharge time will be determined by your treatment team.  Patients discharged the day of surgery will not be allowed to drive home and must have someone with them for 24 hours.    Special instructions:   DO NOT smoke tobacco or vape for 24 hours before your procedure.  Please read over the following fact sheets that you were given. Coughing and Deep Breathing, Surgical Site Infection Prevention, Anesthesia Post-op Instructions, and Care and Recovery After Surgery      Prostatic Urethral Lift Prostatic urethral lift is a surgical procedure to relieve symptoms of prostate gland enlargement that occurs with age (benign prostatic hypertrophy, BPH). The part of the body that drains urine from the bladder (urethra) passes between the two lobes of the prostate. As the prostate enlarges, it can push on the urethra and cause problems with urinating. This procedure involves placing an implant that holds the prostate away from the urethra. The procedure is performed with a thin operating telescope  (cystoscope) that is inserted through the tip of the penis and moved up the urethra to the prostate. This is less invasive than other procedures that require an incision. You may have this procedure if: You have symptoms of BPH. Your prostate is not severely enlarged. Medicines to treat BPH are not working or not tolerated. You want to avoid possible sexual side effects from medicines or other procedures that are used to treat BPH. Tell a health care provider about: Any allergies you have. All medicines you are taking, including vitamins, herbs, eye drops, creams, and over-the-counter medicines. Previous problems you or members of your family have had with the use of anesthetics. Any blood disorders you have. Previous surgeries you have had. Any medical conditions you have. What are the risks? Bleeding. Infection. Leaking of urine (incontinence). Allergic reactions to medicines. Return of BPH symptoms after 2 years, requiring more treatment. What happens before the procedure? Ask your health care provider about: Changing or stopping your regular medicines. This is especially important if you are taking diabetes medicines or blood thinners. Taking medicines such as aspirin and ibuprofen. These medicines can thin your blood. Do not take these medicines before your procedure if your health care provider instructs you not to. Follow instructions from your health care provider  about eating or drinking restrictions. Plan to have someone take you home from the hospital or clinic. What happens during the procedure? To lower your risk of infection: Your health care team will wash or sanitize their hands. Your skin will be washed with soap. You may be given an antibiotic medicine. An IV may be inserted into one of your veins. You will be given one or more of the following: A medicine to help you relax (sedative). A medicine that is injected into your urethra to numb the area (local  anesthetic). A medicine to make you fall asleep (general anesthetic). A cystoscope will be inserted into your penis and moved through your urethra to your prostate. A device will be inserted through the cystoscope and used to press the lobes of your prostate away from your urethra. Implants will be inserted through the device to hold the lobes of your prostate in the widened position. The device and cystoscope will be removed. The procedure may vary among health care providers and hospitals. What happens after the procedure? Your blood pressure, heart rate, breathing rate, and blood oxygen level will be monitored until the medicines you were given have worn off. Do not drive for 24 hours if you were given a sedative. Summary Prostatic urethral lift is a surgical procedure to relieve symptoms of prostate gland enlargement that occurs with age (benign prostatic hypertrophy, BPH). The procedure is performed with a thin operating telescope (cystoscope) that is inserted through the tip of the penis and moved up the part of the body that drains urine from the bladder (urethra) to reach the prostate. This is less invasive than other procedures that require an incision. Plan to have someone take you home from the hospital or clinic. You will not be allowed to drive for 24 hours if you were given a sedative during the procedure. This information is not intended to replace advice given to you by your health care provider. Make sure you discuss any questions you have with your health care provider. Document Revised: 04/01/2020 Document Reviewed: 08/29/2019 Elsevier Patient Education  [place] Anesthesia, Adult, Care After This sheet gives you information about how to care for yourself after your procedure. Your health care provider may also give you more specific instructions. If you have problems or questions, contact your health care provider. What can I expect after the  procedure? After the procedure, the following side effects are common: Pain or discomfort at the IV site. Nausea. Vomiting. Sore throat. Trouble concentrating. Feeling cold or chills. Feeling weak or tired. Sleepiness and fatigue. Soreness and body aches. These side effects can affect parts of the body that were not involved in surgery. Follow these instructions at home: For the time period you were told by your health care provider:  Rest. Do not participate in activities where you could fall or become injured. Do not drive or use machinery. Do not drink alcohol. Do not take sleeping pills or medicines that cause drowsiness. Do not make important decisions or sign legal documents. Do not take care of children on your own. Eating and drinking Follow any instructions from your health care provider about eating or drinking restrictions. When you feel hungry, start by eating small amounts of foods that are soft and easy to digest (bland), such as toast. Gradually return to your regular diet. Drink enough fluid to keep your urine pale yellow. If you vomit, rehydrate by drinking water, juice, or clear broth. General instructions If you have sleep  apnea, surgery and certain medicines can increase your risk for breathing problems. Follow instructions from your health care provider about wearing your sleep device: Anytime you are sleeping, including during daytime naps. While taking prescription pain medicines, sleeping medicines, or medicines that make you drowsy. Have a responsible adult stay with you for the time you are told. It is important to have someone help care for you until you are awake and alert. Return to your normal activities as told by your health care provider. Ask your health care provider what activities are safe for you. Take over-the-counter and prescription medicines only as told by your health care provider. If you smoke, do not smoke without supervision. Keep all  follow-up visits as told by your health care provider. This is important. Contact a health care provider if: You have nausea or vomiting that does not get better with medicine. You cannot eat or drink without vomiting. You have pain that does not get better with medicine. You are unable to pass urine. You develop a skin rash. You have a fever. You have redness around your IV site that gets worse. Get help right away if: You have difficulty breathing. You have chest pain. You have blood in your urine or stool, or you vomit blood. Summary After the procedure, it is common to have a sore throat or nausea. It is also common to feel tired. Have a responsible adult stay with you for the time you are told. It is important to have someone help care for you until you are awake and alert. When you feel hungry, start by eating small amounts of foods that are soft and easy to digest (bland), such as toast. Gradually return to your regular diet. Drink enough fluid to keep your urine pale yellow. Return to your normal activities as told by your health care provider. Ask your health care provider what activities are safe for you. This information is not intended to replace advice given to you by your health care provider. Make sure you discuss any questions you have with your health care provider. Document Revised: 09/05/2019 Document Reviewed: 04/04/2019 Elsevier Patient Education  2022 [employer]. How to Use Chlorhexidine for Bathing Chlorhexidine gluconate (CHG) is a germ-killing (antiseptic) solution that is used to clean the skin. It can get rid of the bacteria that normally live on the skin and can keep them away for about 24 hours. To clean your skin with CHG, you may be given: A CHG solution to use in the shower or as part of a sponge bath. A prepackaged cloth that contains CHG. Cleaning your skin with CHG may help lower the risk for infection: While you are staying in the intensive care unit  of the hospital. If you have a vascular access, such as a central line, to provide short-term or long-term access to your veins. If you have a catheter to drain urine from your bladder. If you are on a ventilator. A ventilator is a machine that helps you breathe by moving air in and out of your lungs. After surgery. What are the risks? Risks of using CHG include: A skin reaction. Hearing loss, if CHG gets in your ears and you have a perforated eardrum. Eye injury, if CHG gets in your eyes and is not rinsed out. The CHG product catching fire. Make sure that you avoid smoking and flames after applying CHG to your skin. Do not use CHG: If you have a chlorhexidine allergy or have previously reacted to chlorhexidine.  On babies younger than 66 months of age. How to use CHG solution Use CHG only as told by your health care provider, and follow the instructions on the label. Use the full amount of CHG as directed. Usually, this is one bottle. During a shower Follow these steps when using CHG solution during a shower (unless your health care provider gives you different instructions): Start the shower. Use your normal soap and shampoo to wash your face and hair. Turn off the shower or move out of the shower stream. Pour the CHG onto a clean washcloth. Do not use any type of brush or rough-edged sponge. Starting at your neck, lather your body down to your toes. Make sure you follow these instructions: If you will be having surgery, pay special attention to the part of your body where you will be having surgery. Scrub this area for at least 1 minute. Do not use CHG on your head or face. If the solution gets into your ears or eyes, rinse them well with water. Avoid your genital area. Avoid any areas of skin that have broken skin, cuts, or scrapes. Scrub your back and under your arms. Make sure to wash skin folds. Let the lather sit on your skin for 1-2 minutes or as long as told by your health care  provider. Thoroughly rinse your entire body in the shower. Make sure that all body creases and crevices are rinsed well. Dry off with a clean towel. Do not put any substances on your body afterward--such as powder, lotion, or perfume--unless you are told to do so by your health care provider. Only use lotions that are recommended by the manufacturer. Put on clean clothes or pajamas. If it is the night before your surgery, sleep in clean sheets.  During a sponge bath Follow these steps when using CHG solution during a sponge bath (unless your health care provider gives you different instructions): Use your normal soap and shampoo to wash your face and hair. Pour the CHG onto a clean washcloth. Starting at your neck, lather your body down to your toes. Make sure you follow these instructions: If you will be having surgery, pay special attention to the part of your body where you will be having surgery. Scrub this area for at least 1 minute. Do not use CHG on your head or face. If the solution gets into your ears or eyes, rinse them well with water. Avoid your genital area. Avoid any areas of skin that have broken skin, cuts, or scrapes. Scrub your back and under your arms. Make sure to wash skin folds. Let the lather sit on your skin for 1-2 minutes or as long as told by your health care provider. Using a different clean, wet washcloth, thoroughly rinse your entire body. Make sure that all body creases and crevices are rinsed well. Dry off with a clean towel. Do not put any substances on your body afterward--such as powder, lotion, or perfume--unless you are told to do so by your health care provider. Only use lotions that are recommended by the manufacturer. Put on clean clothes or pajamas. If it is the night before your surgery, sleep in clean sheets. How to use CHG prepackaged cloths Only use CHG cloths as told by your health care provider, and follow the instructions on the label. Use the  CHG cloth on clean, dry skin. Do not use the CHG cloth on your head or face unless your health care provider tells you to. When washing  with the CHG cloth: Avoid your genital area. Avoid any areas of skin that have broken skin, cuts, or scrapes. Before surgery Follow these steps when using a CHG cloth to clean before surgery (unless your health care provider gives you different instructions): Using the CHG cloth, vigorously scrub the part of your body where you will be having surgery. Scrub using a back-and-forth motion for 3 minutes. The area on your body should be completely wet with CHG when you are done scrubbing. Do not rinse. Discard the cloth and let the area air-dry. Do not put any substances on the area afterward, such as powder, lotion, or perfume. Put on clean clothes or pajamas. If it is the night before your surgery, sleep in clean sheets.  For general bathing Follow these steps when using CHG cloths for general bathing (unless your health care provider gives you different instructions). Use a separate CHG cloth for each area of your body. Make sure you wash between any folds of skin and between your fingers and toes. Wash your body in the following order, switching to a new cloth after each step: The front of your neck, shoulders, and chest. Both of your arms, under your arms, and your hands. Your stomach and groin area, avoiding the genitals. Your right leg and foot. Your left leg and foot. The back of your neck, your back, and your buttocks. Do not rinse. Discard the cloth and let the area air-dry. Do not put any substances on your body afterward--such as powder, lotion, or perfume--unless you are told to do so by your health care provider. Only use lotions that are recommended by the manufacturer. Put on clean clothes or pajamas. Contact a health care provider if: Your skin gets irritated after scrubbing. You have questions about using your solution or cloth. You swallow  any chlorhexidine. Call your local poison control center (1-838-836-4779 in the [place] Get help right away if: Your eyes itch badly, or they become very red or swollen. Your skin itches badly and is red or swollen. Your hearing changes. You have trouble seeing. You have swelling or tingling in your mouth or throat. You have trouble breathing. These symptoms may represent a serious problem that is an emergency. Do not wait to see if the symptoms will go away. Get medical help right away. Call your local emergency services (911 in the [place] Do not drive yourself to the hospital. Summary Chlorhexidine gluconate (CHG) is a germ-killing (antiseptic) solution that is used to clean the skin. Cleaning your skin with CHG may help to lower your risk for infection. You may be given CHG to use for bathing. It may be in a bottle or in a prepackaged cloth to use on your skin. Carefully follow your health care provider's instructions and the instructions on the product label. Do not use CHG if you have a chlorhexidine allergy. Contact your health care provider if your skin gets irritated after scrubbing. This information is not intended to replace advice given to you by your health care provider. Make sure you discuss any questions you have with your health care provider. Document Revised: 03/02/2020 Document Reviewed: 03/02/2020 Elsevier Patient Education  2022 [employer].

## 2020-10-02 ENCOUNTER — Encounter (HOSPITAL_COMMUNITY)
Admission: RE | Admit: 2020-10-02 | Discharge: 2020-10-02 | Disposition: A | Discharge status: Home / Self Care | Payer: Medicare HMO | Source: Ambulatory Visit | Attending: Urology | Admitting: Urology

## 2020-10-02 ENCOUNTER — Other Ambulatory Visit: Payer: Self-pay

## 2020-10-02 ENCOUNTER — Encounter (HOSPITAL_COMMUNITY): Payer: Self-pay

## 2020-10-05 ENCOUNTER — Encounter (HOSPITAL_COMMUNITY)
Admission: RE | Disposition: A | Discharge status: Home / Self Care | Payer: Self-pay | Source: Home / Self Care | Attending: Urology

## 2020-10-05 ENCOUNTER — Ambulatory Visit (HOSPITAL_COMMUNITY): Payer: Medicare HMO | Admitting: Certified Registered"

## 2020-10-05 ENCOUNTER — Encounter (HOSPITAL_COMMUNITY): Payer: Self-pay | Admitting: Urology

## 2020-10-05 ENCOUNTER — Ambulatory Visit (HOSPITAL_COMMUNITY)
Admission: RE | Admit: 2020-10-05 | Discharge: 2020-10-05 | Disposition: A | Discharge status: Home / Self Care | Payer: Medicare HMO | Attending: Urology | Admitting: Urology

## 2020-10-05 ENCOUNTER — Other Ambulatory Visit: Payer: Self-pay

## 2020-10-05 ENCOUNTER — Emergency Department (HOSPITAL_COMMUNITY)
Admission: EM | Admit: 2020-10-05 | Discharge: 2020-10-06 | Disposition: A | Discharge status: Home / Self Care | Payer: Medicare HMO | Source: Home / Self Care | Attending: Emergency Medicine | Admitting: Emergency Medicine

## 2020-10-05 DIAGNOSIS — Z79899 Other long term (current) drug therapy: Secondary | ICD-10-CM | POA: Insufficient documentation

## 2020-10-05 DIAGNOSIS — T839XXA Unspecified complication of genitourinary prosthetic device, implant and graft, initial encounter: Secondary | ICD-10-CM

## 2020-10-05 DIAGNOSIS — Z7901 Long term (current) use of anticoagulants: Secondary | ICD-10-CM | POA: Insufficient documentation

## 2020-10-05 DIAGNOSIS — R310 Gross hematuria: Secondary | ICD-10-CM | POA: Insufficient documentation

## 2020-10-05 DIAGNOSIS — I10 Essential (primary) hypertension: Secondary | ICD-10-CM | POA: Insufficient documentation

## 2020-10-05 DIAGNOSIS — R339 Retention of urine, unspecified: Secondary | ICD-10-CM | POA: Insufficient documentation

## 2020-10-05 DIAGNOSIS — N138 Other obstructive and reflux uropathy: Secondary | ICD-10-CM | POA: Diagnosis not present

## 2020-10-05 DIAGNOSIS — Z96651 Presence of right artificial knee joint: Secondary | ICD-10-CM | POA: Insufficient documentation

## 2020-10-05 DIAGNOSIS — R338 Other retention of urine: Secondary | ICD-10-CM | POA: Insufficient documentation

## 2020-10-05 DIAGNOSIS — N401 Enlarged prostate with lower urinary tract symptoms: Secondary | ICD-10-CM | POA: Diagnosis not present

## 2020-10-05 DIAGNOSIS — Z86718 Personal history of other venous thrombosis and embolism: Secondary | ICD-10-CM | POA: Insufficient documentation

## 2020-10-05 DIAGNOSIS — T83098A Other mechanical complication of other indwelling urethral catheter, initial encounter: Secondary | ICD-10-CM | POA: Diagnosis not present

## 2020-10-05 HISTORY — PX: CYSTOSCOPY WITH INSERTION OF UROLIFT: SHX6678

## 2020-10-05 LAB — URINALYSIS, ROUTINE W REFLEX MICROSCOPIC
Bilirubin Urine: NEGATIVE
Glucose, UA: NEGATIVE mg/dL
Ketones, ur: NEGATIVE mg/dL
Nitrite: NEGATIVE
Protein, ur: 100 mg/dL — AB
RBC / HPF: >50 RBC/hpf — ABNORMAL HIGH (ref 0–5)
Specific Gravity, Urine: 1.013 (ref 1.005–1.030)
WBC, UA: >50 WBC/hpf — ABNORMAL HIGH (ref 0–5)
pH: 6 (ref 5.0–8.0)

## 2020-10-05 SURGERY — CYSTOSCOPY WITH INSERTION OF UROLIFT
Anesthesia: General | Site: Prostate

## 2020-10-05 MED ORDER — PROPOFOL 10 MG/ML IV BOLUS
INTRAVENOUS | Status: AC
  Filled 2020-10-05: qty 20

## 2020-10-05 MED ORDER — LIDOCAINE HCL URETHRAL/MUCOSAL 2 % EX GEL
CUTANEOUS | Status: AC
  Filled 2020-10-05: qty 10

## 2020-10-05 MED ORDER — PROPOFOL 10 MG/ML IV BOLUS
INTRAVENOUS | Status: DC | PRN
  Administered 2020-10-05: 150 mg via INTRAVENOUS

## 2020-10-05 MED ORDER — CHLORHEXIDINE GLUCONATE 0.12 % MT SOLN: 15.0000 mL | Freq: Once | OROMUCOSAL | Status: DC

## 2020-10-05 MED ORDER — LACTATED RINGERS IV SOLN: INTRAVENOUS | Status: DC

## 2020-10-05 MED ORDER — LIDOCAINE HCL (PF) 2 % IJ SOLN
INTRAMUSCULAR | Status: AC
  Filled 2020-10-05: qty 5

## 2020-10-05 MED ORDER — FENTANYL CITRATE PF 50 MCG/ML IJ SOSY: 25.0000 ug | PREFILLED_SYRINGE | INTRAMUSCULAR | Status: DC | PRN

## 2020-10-05 MED ORDER — SODIUM CHLORIDE 0.9 % IR SOLN
Status: DC | PRN
  Administered 2020-10-05: 3000 mL

## 2020-10-05 MED ORDER — FENTANYL CITRATE (PF) 100 MCG/2ML IJ SOLN
INTRAMUSCULAR | Status: DC | PRN
  Administered 2020-10-05: 50 ug via INTRAVENOUS
  Administered 2020-10-05 (×2): 25 ug via INTRAVENOUS

## 2020-10-05 MED ORDER — CEFAZOLIN SODIUM-DEXTROSE 2-4 GM/100ML-% IV SOLN
2.0000 g | INTRAVENOUS | Status: AC
  Administered 2020-10-05: 2 g via INTRAVENOUS
  Filled 2020-10-05: qty 100

## 2020-10-05 MED ORDER — LACTATED RINGERS IV SOLN: INTRAVENOUS | Status: DC | PRN

## 2020-10-05 MED ORDER — ORAL CARE MOUTH RINSE: 15.0000 mL | Freq: Once | OROMUCOSAL | Status: DC

## 2020-10-05 MED ORDER — FENTANYL CITRATE (PF) 100 MCG/2ML IJ SOLN
INTRAMUSCULAR | Status: AC
  Filled 2020-10-05: qty 2

## 2020-10-05 MED ORDER — ONDANSETRON HCL 4 MG/2ML IJ SOLN
INTRAMUSCULAR | Status: AC
  Filled 2020-10-05: qty 2

## 2020-10-05 MED ORDER — ONDANSETRON HCL 4 MG/2ML IJ SOLN: 4.0000 mg | Freq: Once | INTRAMUSCULAR | Status: DC | PRN

## 2020-10-05 MED ORDER — ONDANSETRON HCL 4 MG/2ML IJ SOLN
INTRAMUSCULAR | Status: DC | PRN
  Administered 2020-10-05: 4 mg via INTRAVENOUS

## 2020-10-05 MED ORDER — TRAMADOL HCL 50 MG PO TABS
50.0000 mg | ORAL_TABLET | Freq: Four times a day (QID) | ORAL | 0 refills | Status: AC | PRN
Start: 2020-10-05 — End: 2021-10-05

## 2020-10-05 MED ORDER — WATER FOR IRRIGATION, STERILE IR SOLN
Status: DC | PRN
  Administered 2020-10-05: 1000 mL

## 2020-10-05 MED ORDER — LIDOCAINE 2% (20 MG/ML) 5 ML SYRINGE
INTRAMUSCULAR | Status: DC | PRN
  Administered 2020-10-05: 80 mg via INTRAVENOUS

## 2020-10-05 SURGICAL SUPPLY — 18 items
BAG DRAIN URO TABLE W/ADPT NS (BAG) ×2 IMPLANT
BAG DRN 8 ADPR NS SKTRN CSTL (BAG) ×1
BAG HAMPER (MISCELLANEOUS) ×2 IMPLANT
CLOTH BEACON ORANGE TIMEOUT ST (SAFETY) ×2 IMPLANT
GLOVE SURG POLYISO LF SZ8 (GLOVE) ×2 IMPLANT
GLOVE SURG UNDER POLY LF SZ7 (GLOVE) ×4 IMPLANT
GOWN STRL REUS W/TWL LRG LVL3 (GOWN DISPOSABLE) ×2 IMPLANT
GOWN STRL REUS W/TWL XL LVL3 (GOWN DISPOSABLE) ×2 IMPLANT
KIT TURNOVER CYSTO (KITS) ×2 IMPLANT
MANIFOLD NEPTUNE II (INSTRUMENTS) ×2 IMPLANT
PACK CYSTO (CUSTOM PROCEDURE TRAY) ×2 IMPLANT
PAD ARMBOARD 7.5X6 YLW CONV (MISCELLANEOUS) ×2 IMPLANT
SYSTEM UROLIFT (Male Continence) ×10 IMPLANT
TOWEL OR 17X26 4PK STRL BLUE (TOWEL DISPOSABLE) ×2 IMPLANT
TRAY FOLEY W/BAG SLVR 16FR (SET/KITS/TRAYS/PACK) ×2
TRAY FOLEY W/BAG SLVR 16FR ST (SET/KITS/TRAYS/PACK) ×1 IMPLANT
WATER STERILE IRR 3000ML UROMA (IV SOLUTION) ×2 IMPLANT
WATER STERILE IRR 500ML POUR (IV SOLUTION) ×2 IMPLANT

## 2020-10-05 NOTE — ED Notes (Signed)
Irrigated pt foley with 21ml of sterile water and had a return of 687ml dark brown urine. Irrigated another 22ml and returned the entire 69ml.

## 2020-10-05 NOTE — Anesthesia Procedure Notes (Signed)
Procedure Name: LMA Insertion Date/Time: 10/05/2020 11:51 AM Performed by: Gwyndolyn Saxon, CRNA Pre-anesthesia Checklist: Patient identified, Emergency Drugs available, Suction available and Patient being monitored Patient Re-evaluated:Patient Re-evaluated prior to induction Oxygen Delivery Method: Circle system utilized Preoxygenation: Pre-oxygenation with 100% oxygen Induction Type: IV induction Ventilation: Mask ventilation without difficulty LMA: LMA inserted LMA Size: 5.0 Number of attempts: 1 Placement Confirmation: positive ETCO2 and breath sounds checked- equal and bilateral Tube secured with: Tape Dental Injury: Teeth and Oropharynx as per pre-operative assessment

## 2020-10-05 NOTE — Anesthesia Postprocedure Evaluation (Signed)
Anesthesia Post Note  Patient: Johnny Fischer  Procedure(s) Performed: CYSTOSCOPY WITH INSERTION OF UROLIFT (Prostate)  Patient location during evaluation: Phase II Anesthesia Type: General Level of consciousness: awake Pain management: pain level controlled Vital Signs Assessment: post-procedure vital signs reviewed and stable Respiratory status: spontaneous breathing and respiratory function stable Cardiovascular status: blood pressure returned to baseline and stable Postop Assessment: no headache and no apparent nausea or vomiting Anesthetic complications: no Comments: Late entry   No notable events documented.   Last Vitals:  Vitals:   10/05/20 1315 10/05/20 1329  BP: 137/63 (!) 144/67  Pulse: (!) 55 65  Resp: 13 15  Temp:  36.7 C  SpO2: 98% 99%    Last Pain:  Vitals:   10/05/20 1329  TempSrc: Oral  PainSc: 0-No pain                 Louann Sjogren

## 2020-10-05 NOTE — Anesthesia Preprocedure Evaluation (Signed)
Anesthesia Evaluation  Patient identified by MRN, date of birth, ID band Patient awake    Reviewed: Allergy & Precautions, H&P , NPO status , Patient's Chart, lab work & pertinent test results, reviewed documented beta blocker date and time   Airway Mallampati: II  TM Distance: >3 FB Neck ROM: full    Dental no notable dental hx.    Pulmonary neg pulmonary ROS,    Pulmonary exam normal breath sounds clear to auscultation       Cardiovascular Exercise Tolerance: Good hypertension, negative cardio ROS   Rhythm:regular Rate:Normal     Neuro/Psych negative neurological ROS  negative psych ROS   GI/Hepatic Neg liver ROS, GERD  Medicated,  Endo/Other  negative endocrine ROS  Renal/GU Renal disease  negative genitourinary   Musculoskeletal   Abdominal   Peds  Hematology negative hematology ROS (+)   Anesthesia Other Findings   Reproductive/Obstetrics negative OB ROS                             Anesthesia Physical Anesthesia Plan  ASA: 2  Anesthesia Plan: General   Post-op Pain Management:    Induction:   PONV Risk Score and Plan: Ondansetron  Airway Management Planned:   Additional Equipment:   Intra-op Plan:   Post-operative Plan:   Informed Consent: I have reviewed the patients History and Physical, chart, labs and discussed the procedure including the risks, benefits and alternatives for the proposed anesthesia with the patient or authorized representative who has indicated his/her understanding and acceptance.     Dental Advisory Given  Plan Discussed with: CRNA  Anesthesia Plan Comments:         Anesthesia Quick Evaluation

## 2020-10-05 NOTE — ED Provider Notes (Signed)
[facility] EMERGENCY DEPARTMENT Provider Note   CSN: [id number] Arrival date & time: 10/05/20  1903     History Chief Complaint  Patient presents with   Urinary Retention    Johnny Fischer is a 75 y.o. male.  HPI     This is a 75 year old male with a history of DVT, hypertension who presents with urinary retention.  Patient had a UroLift procedure earlier today by Dr. Alyson Ingles.  He had a Foley catheter placed.  States that since 1 PM he has not had any drainage from the catheter.  He reports that he had significant abdominal pressure.  He stopped his blood thinner in order to have this procedure and has not reinitiated it yet.  Denies any fevers, back pain.  No nausea or vomiting.  Prior to my assessment, nursing irrigated Foley with 10 cc of saline with return of 650 cc of blood-tinged urine.  Patient felt immediate relief.  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   HYPERGLYCEMIA, FASTING 11/14/2007   OTHER NONSPECIFIC FINDINGS EXAMINATION OF BLOOD 11/14/2007   KNEE PAIN, LEFT 11/24/2006   WRIST PAIN 11/10/2006   GERD, SEVERE 06/15/2006   HYPERLIPIDEMIA 12/07/2005   OBESITY NOS 12/07/2005   HYPERTENSION 65/78/4696   HERNIA, UMBILICAL 29/52/8413   CALCULUS, KIDNEY 12/07/2005   OSTEOARTHRITIS 12/07/2005   DVT, HX OF 12/07/2005    Past Surgical History:  Procedure Laterality Date   CYSTOSCOPY N/A 05/04/2020   Procedure: CYSTOSCOPY;  Surgeon: Cleon Gustin, MD;  Location: [facility];  Service: Urology;  Laterality: N/A;   CYSTOSCOPY WITH INSERTION OF UROLIFT N/A 05/04/2020   Procedure: CYSTOSCOPY WITH INSERTION OF UROLIFT;  Surgeon: Cleon Gustin,  MD;  Location: [facility];  Service: Urology;  Laterality: N/A;   JOINT REPLACEMENT     TOTAL KNEE ARTHROPLASTY Right        Family History  Family history unknown: Yes    Social History   Tobacco Use   Smoking status: Never   Smokeless tobacco: Never  Vaping Use   Vaping Use: Never used  Substance Use Topics   Alcohol use: Not Currently   Drug use: Never    Home Medications Prior to Admission medications   Medication Sig Start Date End Date Taking? Authorizing Provider  amLODipine (NORVASC) 10 MG tablet Take 10 mg by mouth daily. 04/28/20   [provider]  benazepril (LOTENSIN) 10 MG tablet Take 10 mg by mouth daily. 01/08/20   [provider]  carvedilol (COREG) 12.5 MG tablet Take 12.5 mg by mouth 2 (two) times daily. 01/08/20   [provider]  cephALEXin (KEFLEX) 500 MG capsule Take 1 capsule (500 mg total) by mouth 3 (three) times daily. 09/25/20   Veryl Speak, MD  chlorthalidone (HYGROTON) 25 MG tablet Take 25 mg by mouth daily. 07/28/20   [provider]  ELIQUIS 5 MG TABS tablet Take 5 mg by mouth 2 (two) times daily. 01/28/20   [provider]  furosemide (LASIX) 40 MG tablet Take 40 mg by mouth daily. 01/08/20   [provider]  losartan (COZAAR) 100 MG tablet Take 100 mg by mouth daily. 04/28/20  [provider]  olmesartan (BENICAR) 40 MG tablet Take 40 mg by mouth daily.    [provider]  spironolactone (ALDACTONE) 25 MG tablet Take 25 mg by mouth 2 (two) times daily. 01/08/20   [provider]  sulfamethoxazole-trimethoprim (BACTRIM DS) 800-160 MG tablet Take 1 tablet by mouth every 12 (twelve) hours. 07/15/20   McKenzie, Candee Furbish, MD  tamsulosin (FLOMAX) 0.4 MG CAPS capsule Take 1 capsule (0.4 mg total) by mouth daily. Patient taking differently: Take 0.8 mg by mouth daily. 05/06/20   McKenzie, Candee Furbish, MD  traMADol (ULTRAM) 50 MG tablet Take 1 tablet (50 mg total) by mouth every 6 (six) hours  as needed. 10/05/20 10/05/21  Cleon Gustin, MD    Allergies    Patient has no known allergies.  Review of Systems   Review of Systems  Constitutional:  Negative for fever.  Respiratory:  Negative for shortness of breath.   Cardiovascular:  Negative for chest pain.  Gastrointestinal:  Negative for abdominal pain.  Genitourinary:  Positive for difficulty urinating and hematuria.  All other systems reviewed and are negative.  Physical Exam Updated Vital Signs BP 114/64   Pulse 62   Temp (!) 97.1 F (36.2 C) (Oral)   Resp 15   SpO2 99%   Physical Exam Vitals and nursing note reviewed.  Constitutional:      Appearance: He is well-developed. He is obese. He is not ill-appearing.  HENT:     Head: Normocephalic and atraumatic.  Eyes:     Pupils: Pupils are equal, round, and reactive to light.  Cardiovascular:     Rate and Rhythm: Normal rate and regular rhythm.  Pulmonary:     Effort: Pulmonary effort is normal. No respiratory distress.  Abdominal:     Palpations: Abdomen is soft.     Tenderness: There is no abdominal tenderness. There is no rebound.  Genitourinary:    Comments: Foley catheter in place, approximately 800 cc of grossly bloody urine noted in Foley bag Musculoskeletal:     Cervical back: Neck supple.  Lymphadenopathy:     Cervical: No cervical adenopathy.  Skin:    General: Skin is warm and dry.  Neurological:     Mental Status: He is alert and oriented to person, place, and time.  Psychiatric:        Mood and Affect: Mood normal.    ED Results / Procedures / Treatments   Labs (all labs ordered are listed, but only abnormal results are displayed) Labs Reviewed  URINALYSIS, ROUTINE W REFLEX MICROSCOPIC - Abnormal; Notable for the following components:      Result Value   Color, Urine RED (*)    APPearance CLOUDY (*)    Hgb urine dipstick MODERATE (*)    Protein, ur 100 (*)    Leukocytes,Ua TRACE (*)    RBC / HPF >50 (*)    WBC, UA >50 (*)     Bacteria, UA RARE (*)    All other components within normal limits    EKG None  Radiology No results found.  Procedures Procedures   Medications Ordered in ED Medications - No data to display  ED Course  I have reviewed the triage vital signs and the nursing notes.  Pertinent labs & imaging results that were available during my care of the patient were reviewed by me and considered in my medical decision making (see chart for details).    MDM Rules/Calculators/A&P  Patient presents with urinary retention secondary to clogged Foley catheter.  He is nontoxic and vital signs are reassuring.  Foley was flushed prior to my evaluation and he now has approximately 800 cc of grossly bloody urine in his Foley bag.  He reports complete relief.  He has no other complaints at this time.  He has not had any fevers.  Abdominal exam is benign.  Urinalysis shows typical red and white cells after procedure and indwelling Foley.  Doubt acute urinary tract infection.  Patient instructed to follow-up with urology as scheduled.  After history, exam, and medical workup I feel the patient has been appropriately medically screened and is safe for discharge home. Pertinent diagnoses were discussed with the patient. Patient was given return precautions.  Final Clinical Impression(s) / ED Diagnoses Final diagnoses:  Complication of Foley catheter, initial encounter [facility])  Gross hematuria    Rx / DC Orders ED Discharge Orders     None        Zsazsa Bahena, Barbette Hair, MD 10/06/20 0001

## 2020-10-05 NOTE — Op Note (Signed)
   PREOPERATIVE DIAGNOSIS:  Benign prostatic hypertrophy with bladder outlet obstruction.  POSTOPERATIVE DIAGNOSIS:  Benign prostatic hypertrophy with bladder outlet obstruction.  PROCEDURE:  Cystoscopy with implantation of UroLift devices, 5 implants.  SURGEON:  Nicolette Bang, M.D.  ANESTHESIA:  General  ANTIBIOTICS: ancef  SPECIMEN:  None.  DRAINS:  A 16-French Foley catheter.  BLOOD LOSS:  Minimal.  COMPLICATIONS:  None.  INDICATIONS: The Patient is an 75 year old male with BPH and bladder outlet obstruction.  He has failed medical therapy and has elected UroLift for definitive treatment.  FINDINGS OF PROCEDURE:  He was taken to the operating room where a general anesthetic was induced.  He was placed in lithotomy position and was fitted with PAS hose.  His perineum and genitalia were prepped with chlorhexidine, and he was draped in usual sterile fashion.  Cystoscopy was performed using the UroLift scope and 0 degree lens. Examination revealed a normal urethra.  The external sphincter was intact.  Prostatic urethra was approximately 5 cm in length with lateral lobe enlargement. There was also little bit of bladder neck elevation. Inspection of bladder revealed mild-to-moderate trabeculation with no tumors, stones, or inflammation.  No cellules or diverticula were noted. Ureteral orifices were in their normal anatomic position effluxing clear urine.  After initial cystoscopy, the visual obturator was replaced with the first UroLift device.  This was turned to the 9 o'clock position and pulled back to the veru and then slightly advanced.  Pressure was then applied to the right lateral lobe and the UroLift device was deployed.  The second UroLift device was then inserted and applied to the left lateral lobe at 3 o'clock and deployed in the mid prostatic urethra. After this, there was still some apparent obstruction closer to the bladder neck.  So a second and third  level of UroLift your left device was applied between the mid urethra and the proximal urethra providing further patency to the prostatic urethra.  The scope was removed and a 16-French Foley catheter was inserted without difficulty. The balloon was filled with 10 mL sterile fluid, and the catheter was placed to straight drainage.  COMPLICATIONS: None   CONDITION: Stable, extubated, transferred to PACU  PLAN: The patient will be discharged home and followup in 2 days for a voiding trial.

## 2020-10-05 NOTE — H&P (Signed)
Urology Admission H&P  Chief Complaint: urinary retention  History of Present Illness: Mr Stickler is a 75yo with a history of BPh and urinary retention here for urolift. He currently has an indwelling foley.  Past Medical History:  Diagnosis Date   Arthritis    DVT (deep vein thrombosis) 12/2019   Hypertension    Past Surgical History:  Procedure Laterality Date   CYSTOSCOPY N/A 05/04/2020   Procedure: CYSTOSCOPY;  Surgeon: Cleon Gustin, MD;  Location: [facility];  Service: Urology;  Laterality: N/A;   CYSTOSCOPY WITH INSERTION OF UROLIFT N/A 05/04/2020   Procedure: CYSTOSCOPY WITH INSERTION OF UROLIFT;  Surgeon: Cleon Gustin, MD;  Location: [facility];  Service: Urology;  Laterality: N/A;   JOINT REPLACEMENT     TOTAL KNEE ARTHROPLASTY Right     Home Medications:  Current Facility-Administered Medications  Medication Dose Route Frequency Provider Last Rate Last Admin   ceFAZolin (ANCEF) IVPB 2g/100 mL premix  2 g Intravenous 30 min Pre-Op Mete Purdum, Candee Furbish, MD       Allergies: No Known Allergies  Family History  Family history unknown: Yes   Social History:  reports that he has never smoked. He has never used smokeless tobacco. He reports that he does not currently use alcohol. He reports that he does not use drugs.  Review of Systems  Genitourinary:  Positive for difficulty urinating.  All other systems reviewed and are negative.  Physical Exam:  Vital signs in last 24 hours: Temp:  [98.2 F (36.8 C)] 98.2 F (36.8 C) (10/03 1059) Resp:  [18] 18 (10/03 1059) BP: (138)/(66) 138/66 (10/03 1059) SpO2:  [98 %] 98 % (10/03 1059) Physical Exam Vitals reviewed.  Constitutional:      Appearance: Normal appearance.  HENT:     Head: Normocephalic and atraumatic.     Nose: Nose normal.     Mouth/Throat:     Mouth: Mucous membranes are dry.  Eyes:     Extraocular Movements: Extraocular movements intact.     Pupils: Pupils are equal, round, and reactive to light.   Cardiovascular:     Rate and Rhythm: Normal rate and regular rhythm.  Pulmonary:     Effort: Pulmonary effort is normal. No respiratory distress.  Abdominal:     General: Abdomen is flat. There is no distension.  Musculoskeletal:        General: No swelling. Normal range of motion.     Cervical back: Normal range of motion and neck supple.  Skin:    General: Skin is warm and dry.  Neurological:     General: No focal deficit present.     Mental Status: He is alert and oriented to person, place, and time.  Psychiatric:        Mood and Affect: Mood normal.        Behavior: Behavior normal.        Thought Content: Thought content normal.        Judgment: Judgment normal.    Laboratory Data:  No results found for this or any previous visit (from the past 24 hour(s)). No results found for this or any previous visit (from the past 240 hour(s)). Creatinine: No results for input(s): CREATININE in the last 168 hours. Baseline Creatinine: unknwon  Impression/Assessment:  75yo with BPH and urinary retention  Plan:  We discussed the management of his BPH including continued medical therapy, Rezum, Urolift, TURP and simple prostatectomy. After discussing the options the patient has elected to proceed with  Urolift. Risks/benefits/alternatives discussed.   Nicolette Bang 10/05/2020, 11:26 AM

## 2020-10-05 NOTE — ED Triage Notes (Signed)
Arrives to ED from home c/o "my catheter is stopped up". HCP drained from stent today at 1pm. Pt states no urine has drained since 1pm today. Pt denies pain. Bloody trace of urine in drainage bag

## 2020-10-05 NOTE — Transfer of Care (Signed)
Immediate Anesthesia Transfer of Care Note  Patient: Johnny Fischer  Procedure(s) Performed: CYSTOSCOPY WITH INSERTION OF UROLIFT (Prostate)  Patient Location: PACU  Anesthesia Type:General  Level of Consciousness: drowsy and patient cooperative  Airway & Oxygen Therapy: Patient Spontanous Breathing and Patient connected to nasal cannula oxygen  Post-op Assessment: Report given to RN and Post -op Vital signs reviewed and stable  Post vital signs: Reviewed and stable  Last Vitals:  Vitals Value Taken Time  BP    Temp    Pulse    Resp    SpO2      Last Pain:  Vitals:   10/05/20 1059  TempSrc: Oral  PainSc: 0-No pain      Patients Stated Pain Goal: 6 (27/74/12 8786)  Complications: No notable events documented.

## 2020-10-06 ENCOUNTER — Encounter (HOSPITAL_COMMUNITY): Payer: Self-pay | Admitting: Urology

## 2020-10-06 ENCOUNTER — Ambulatory Visit (INDEPENDENT_AMBULATORY_CARE_PROVIDER_SITE_OTHER): Payer: Medicare HMO

## 2020-10-06 DIAGNOSIS — R339 Retention of urine, unspecified: Secondary | ICD-10-CM

## 2020-10-06 NOTE — Progress Notes (Signed)
Patient present in office today stating catheter was not draining. Upon assessment catheter appeared to be draining without difficulty.  Fill and Pull Catheter Removal  Patient is present today for a catheter removal.  Patient was cleaned and prepped in a sterile fashion 421ml of sterile water/ saline was instilled into the bladder when the patient felt the urge to urinate. 54ml of water was then drained from the balloon.  A 16FR foley cath was removed from the bladder no complications were noted .  Patient was then given some time to void on their own.  Patient cannot void. Patient is feeling uncomfortable and request to have catheter placed.  Simple Catheter Placement  Due to urinary retention patient is present today for a foley cath placement.  Patient was cleaned and prepped in a sterile fashion with betadine. A 16 FR foley catheter was inserted, urine return was noted  460ml, urine was yellow in color.  The balloon was filled with 10cc of sterile water.  A bed side bag was attached for drainage.  Patient was given instruction on proper catheter care.  Patient tolerated well, no complications were noted   Performed by: Almando Brawley LPN  Additional notes/ Follow up: Patient will keep scheduled postop tomorrow

## 2020-10-06 NOTE — Patient Instructions (Signed)
Foley Catheter Care and Patient Education  Perform catheter care every day.  You can do this while in the shower, but NOT while taking a tub bath.  You will need the following supplies: -mild soap, such as Dove -water -a clean washcloth (not one already used for bathing) or a 4x4 piece of gauze -1 Cath-Secure -night drainage bag -2 alcohol swabs  Was you hands thoroughly with soap and water Using mild soap and water, clan your genital area Men should retract the foreskin, if needed, and clean the area, including the penis Women should separate the labia, and clean the area from front to back  3. Clean your urinary opening, which is where the catheter enters your body. 4. Clean the catheter from where it enters your body and then down, away from your body.  Hold the catheter at the point it enters your body so that you don't put tension on it. 5. Rinse the area well and dry it gently.  Changing the drainage bag You will change your drainage bag twice a day -in the morning after you shower, change he night bag to the leg bag -at night before you go to bed, change the leg bag to the night bag  Wash your hands thoroughly with soap and water Empty the urine from the drainage bag into the toilet before you change it Pinch off the catheter with your fingers and disconnect the used bag Wipe the end of the catheter using an alcohol pad Wipe the connector on the bag using the second alcohol pad Connect the clean bag to the catheter and release your finger pinch Check all connections.  Straighten any kinks or twits in the tubing  Caring for the Leg bag -always wear the leg bag below your knee.  This will help it drain. -keep the leg bag secure with the velcro straps.  If the straps leave a mark on your leg they are to tight and should be loosened.  Leaving the straps too tight can decrease you circulation and lead to blood clots. -empty the leg bag through the spout at the bottom every  2-4 hours as needed.  Do not let the bag become completely full. -do not lie down for longer than 2 hours while you are wearing the leg bag.  Caring for the Night Bag -always keep the night bag below the level of your bladder . -to hang your night bag while you sleep, place a clean plastic bag inside of a wastebasket.  Hang the night bag on the inside of the wastebasket.  Cleaning the Drainage bag Wash you hands thoroughly with soap and water. Rinse the equipment with cool water.  Do not use hot water it can damage the plastic equipment. Was the equipment with a mild liquid detergent (ivory) and rinse with cool water. To decrease odor, fill the bag halfway with a mixture of 1 part vinegar and 3 parts water. Shake the bag and let it sit for 15 minutes. Rinse the bag with cool water and hang it up to dry.  Preventing infection -keep the drainage bag below the level of your bladder and off the floor at all times. -keep the catheter secured to your thigh to prevent it from moving. -do not lie on or block the flow of urine in the tubing. -shower daily to keep the catheter clean. -clean your hands before and after touching the catheter or bag. -the spout of the drainage bag should never touch the side of   the toilet or any emptying container.  Special Points -You may see some blood or urine around where the catheter enters your body, especially when walking or having a bowel movement.  This is normal, as long as there is urine draining into the drainage bag.  If you experience significant leakage around  catheter tube where it enters your urethra possibly associated with lower abdominal cramping you could be having a bladder spasm.  Please notify your doctor and we can prescribe you a medication to improve your symptoms. -drink 1-2 glasses of liquids every 2 hours while you're awake.  Call your doctor immediately if -your catheter comes out, do not try to replace it yourself -you have  temperature of 101F (38.8C) or higher -you have decrease in the amount of urine you are making -you have foul-smelling urine -you have bright red blood or large blood clots in your urine -you have abdominal pain and no urine in your catheter bag   

## 2020-10-06 NOTE — Discharge Instructions (Addendum)
You were seen today for clogged Foley catheter.  This was flushed without incident.  Follow-up with urology as planned.

## 2020-10-07 ENCOUNTER — Encounter: Payer: Self-pay | Admitting: Urology

## 2020-10-07 ENCOUNTER — Ambulatory Visit: Payer: Medicare HMO | Admitting: Urology

## 2020-10-07 ENCOUNTER — Other Ambulatory Visit: Payer: Self-pay

## 2020-10-07 VITALS — BP 154/78 | HR 81 | Ht 69.0 in | Wt 255.1 lb

## 2020-10-07 DIAGNOSIS — R339 Retention of urine, unspecified: Secondary | ICD-10-CM | POA: Diagnosis not present

## 2020-10-07 NOTE — Progress Notes (Signed)
Urological Symptom Review  Patient is experiencing the following symptoms: Hard to postpone urination Negative for symptoms Review of Systems  Gastrointestinal (upper)  : Negative for upper GI symptoms  Gastrointestinal (lower) : Negative for lower GI symptoms  Constitutional : Negative for symptoms  Skin: Negative for skin symptoms  Eyes: Negative for eye symptoms  Ear/Nose/Throat : Negative for Ear/Nose/Throat symptoms  Hematologic/Lymphatic: Negative for Hematologic/Lymphatic symptoms  Cardiovascular : Negative for cardiovascular symptoms  Respiratory : Negative for respiratory symptoms  Endocrine: Negative for endocrine symptoms  Musculoskeletal: Negative for musculoskeletal symptoms  Neurological: Negative for neurological symptoms  Psychologic: Negative for psychiatric symptoms

## 2020-10-12 DIAGNOSIS — R310 Gross hematuria: Secondary | ICD-10-CM | POA: Diagnosis not present

## 2020-10-12 DIAGNOSIS — Z6841 Body Mass Index (BMI) 40.0 and over, adult: Secondary | ICD-10-CM | POA: Diagnosis not present

## 2020-10-13 ENCOUNTER — Other Ambulatory Visit: Payer: Self-pay

## 2020-10-13 ENCOUNTER — Ambulatory Visit (INDEPENDENT_AMBULATORY_CARE_PROVIDER_SITE_OTHER): Payer: Medicare HMO

## 2020-10-13 DIAGNOSIS — R339 Retention of urine, unspecified: Secondary | ICD-10-CM | POA: Diagnosis not present

## 2020-10-13 NOTE — Patient Instructions (Signed)

## 2020-10-13 NOTE — Progress Notes (Signed)
Fill and Pull Catheter Removal  Patient is present today for a catheter removal.  Patient was cleaned and prepped in a sterile fashion 357ml of sterile water/ saline was instilled into the bladder when the patient felt the urge to urinate. 70ml of water was then drained from the balloon.  A 16FR foley cath was removed from the bladder no complications were noted .  Patient as then given some time to void on their own.  Patient cannot void  50ml on their own after some time.  Patient tolerated well.  Performed by: Dartanyon Frankowski LPN  Follow up/ Additional notes: Patient will come back this afternoon for PVR  Patient returned unable to void.   Simple Catheter Placement  Due to urinary retention patient is present today for a foley cath placement.  Patient was cleaned and prepped in a sterile fashion with betadine. A 16 FR foley catheter was inserted, urine return was noted  439ml, urine was yellow in color.  The balloon was filled with 10cc of sterile water.  A leg bag was attached for drainage. Patient was also given a night bag to take home and was given instruction on how to change from one bag to another.  Patient was given instruction on proper catheter care.  Patient tolerated well, no complications were noted   Performed by: Chesni Vos LPN  Additional notes/ Follow up: Keep next scheduled OV

## 2020-10-20 ENCOUNTER — Encounter: Payer: Self-pay | Admitting: Urology

## 2020-10-20 NOTE — Patient Instructions (Signed)
Benign Prostatic Hyperplasia Benign prostatic hyperplasia (BPH) is an enlarged prostate gland that is caused by the normal aging process and not by cancer. The prostate is a walnut-sized gland that is involved in the production of semen. It is located in front of the rectum and below the bladder. The bladder stores urine and the urethra is the tube that carries the urine out of the body. The prostate may get bigger as a man gets older. An enlarged prostate can press on the urethra. This can make it harder to pass urine. The build-up of urine in the bladder can cause infection. Back pressure and infection may progress to bladder damage and kidney (renal) failure. What are the causes? This condition is part of a normal aging process. However, not all men develop problems from this condition. If the prostate enlarges away from the urethra, urine flow will not be blocked. If it enlarges toward the urethra and compresses it, there will be problems passing urine. What increases the risk? This condition is more likely to develop in men over the age of 50 years. What are the signs or symptoms? Symptoms of this condition include: Getting up often during the night to urinate. Needing to urinate frequently during the day. Difficulty starting urine flow. Decrease in size and strength of your urine stream. Leaking (dribbling) after urinating. Inability to pass urine. This needs immediate treatment. Inability to completely empty your bladder. Pain when you pass urine. This is more common if there is also an infection. Urinary tract infection (UTI). How is this diagnosed? This condition is diagnosed based on your medical history, a physical exam, and your symptoms. Tests will also be done, such as: A post-void bladder scan. This measures any amount of urine that may remain in your bladder after you finish urinating. A digital rectal exam. In a rectal exam, your health care provider checks your prostate by  putting a lubricated, gloved finger into your rectum to feel the back of your prostate gland. This exam detects the size of your gland and any abnormal lumps or growths. An exam of your urine (urinalysis). A prostate specific antigen (PSA) screening. This is a blood test used to screen for prostate cancer. An ultrasound. This test uses sound waves to electronically produce a picture of your prostate gland. Your health care provider may refer you to a specialist in kidney and prostate diseases (urologist). How is this treated? Once symptoms begin, your health care provider will monitor your condition (active surveillance or watchful waiting). Treatment for this condition will depend on the severity of your condition. Treatment may include: Observation and yearly exams. This may be the only treatment needed if your condition and symptoms are mild. Medicines to relieve your symptoms, including: Medicines to shrink the prostate. Medicines to relax the muscle of the prostate. Surgery in severe cases. Surgery may include: Prostatectomy. In this procedure, the prostate tissue is removed completely through an open incision or with a laparoscope or robotics. Transurethral resection of the prostate (TURP). In this procedure, a tool is inserted through the opening at the tip of the penis (urethra). It is used to cut away tissue of the inner core of the prostate. The pieces are removed through the same opening of the penis. This removes the blockage. Transurethral incision (TUIP). In this procedure, small cuts are made in the prostate. This lessens the prostate's pressure on the urethra. Transurethral microwave thermotherapy (TUMT). This procedure uses microwaves to create heat. The heat destroys and removes a   small amount of prostate tissue. Transurethral needle ablation (TUNA). This procedure uses radio frequencies to destroy and remove a small amount of prostate tissue. Interstitial laser coagulation (ILC).  This procedure uses a laser to destroy and remove a small amount of prostate tissue. Transurethral electrovaporization (TUVP). This procedure uses electrodes to destroy and remove a small amount of prostate tissue. Prostatic urethral lift. This procedure inserts an implant to push the lobes of the prostate away from the urethra. Follow these instructions at home: Take over-the-counter and prescription medicines only as told by your health care provider. Monitor your symptoms for any changes. Contact your health care provider with any changes. Avoid drinking large amounts of liquid before going to bed or out in public. Avoid or reduce how much caffeine or alcohol you drink. Give yourself time when you urinate. Keep all follow-up visits as told by your health care provider. This is important. Contact a health care provider if: You have unexplained back pain. Your symptoms do not get better with treatment. You develop side effects from the medicine you are taking. Your urine becomes very dark or has a bad smell. Your lower abdomen becomes distended and you have trouble passing your urine. Get help right away if: You have a fever or chills. You suddenly cannot urinate. You feel lightheaded, or very dizzy, or you faint. There are large amounts of blood or clots in the urine. Your urinary problems become hard to manage. You develop moderate to severe low back or flank pain. The flank is the side of your body between the ribs and the hip. These symptoms may represent a serious problem that is an emergency. Do not wait to see if the symptoms will go away. Get medical help right away. Call your local emergency services (911 in the [place] Do not drive yourself to the hospital. Summary Benign prostatic hyperplasia (BPH) is an enlarged prostate that is caused by the normal aging process and not by cancer. An enlarged prostate can press on the urethra. This can make it hard to pass urine. This  condition is part of a normal aging process and is more likely to develop in men over the age of 50 years. Get help right away if you suddenly cannot urinate. This information is not intended to replace advice given to you by your health care provider. Make sure you discuss any questions you have with your health care provider. Document Revised: 04/01/2020 Document Reviewed: 08/29/2019 [employer]vier Patient Education  2022 [employer]  

## 2020-10-20 NOTE — Progress Notes (Signed)
10/07/2020 8:31 AM   Johnny Fischer 11/11/1945 [id number]  Referring provider: Neale Burly, MD [place],  [place] [address]  Urinary retention   HPI: Johnny Fischer is a 75yo here for followup for BPH with urinary retention. His foley was removed yesterday and the patient could not urinate and the foley was replaced. He had stopped his BPh medications. NO hematuria. No other complaints today   PMH: Past Medical History:  Diagnosis Date   Arthritis    DVT (deep vein thrombosis) 12/2019   Hypertension     Surgical History: Past Surgical History:  Procedure Laterality Date   CYSTOSCOPY N/A 05/04/2020   Procedure: CYSTOSCOPY;  Surgeon: Cleon Gustin, MD;  Location: [facility];  Service: Urology;  Laterality: N/A;   CYSTOSCOPY WITH INSERTION OF UROLIFT N/A 05/04/2020   Procedure: CYSTOSCOPY WITH INSERTION OF UROLIFT;  Surgeon: Cleon Gustin, MD;  Location: [facility];  Service: Urology;  Laterality: N/A;   CYSTOSCOPY WITH INSERTION OF UROLIFT N/A 10/05/2020   Procedure: CYSTOSCOPY WITH INSERTION OF UROLIFT;  Surgeon: Cleon Gustin, MD;  Location: [facility];  Service: Urology;  Laterality: N/A;   JOINT REPLACEMENT     TOTAL KNEE ARTHROPLASTY Right     Home Medications:  Allergies as of 10/07/2020   No Known Allergies      Medication List        Accurate as of October 07, 2020 11:59 PM. If you have any questions, ask your nurse or doctor.          amLODipine 10 MG tablet Commonly known as: NORVASC Take 10 mg by mouth daily.   benazepril 10 MG tablet Commonly known as: LOTENSIN Take 10 mg by mouth daily.   carvedilol 12.5 MG tablet Commonly known as: COREG Take 12.5 mg by mouth 2 (two) times daily.   cephALEXin 500 MG capsule Commonly known as: KEFLEX Take 1 capsule (500 mg total) by mouth 3 (three) times daily.   chlorthalidone 25 MG tablet Commonly known as: HYGROTON Take 25 mg by mouth daily.   Eliquis 5 MG Tabs tablet Generic  drug: apixaban Take 5 mg by mouth 2 (two) times daily.   furosemide 40 MG tablet Commonly known as: LASIX Take 40 mg by mouth daily.   losartan 100 MG tablet Commonly known as: COZAAR Take 100 mg by mouth daily.   olmesartan 40 MG tablet Commonly known as: BENICAR Take 40 mg by mouth daily.   spironolactone 25 MG tablet Commonly known as: ALDACTONE Take 25 mg by mouth 2 (two) times daily.   sulfamethoxazole-trimethoprim 800-160 MG tablet Commonly known as: BACTRIM DS Take 1 tablet by mouth every 12 (twelve) hours.   tamsulosin 0.4 MG Caps capsule Commonly known as: FLOMAX Take 1 capsule (0.4 mg total) by mouth daily. What changed: how much to take   traMADol 50 MG tablet Commonly known as: Ultram Take 1 tablet (50 mg total) by mouth every 6 (six) hours as needed.        Allergies: No Known Allergies  Family History: Family History  Family history unknown: Yes    Social History:  reports that he has never smoked. He has never used smokeless tobacco. He reports that he does not currently use alcohol. He reports that he does not use drugs.  ROS: All other review of systems were reviewed and are negative except what is noted above in HPI  Physical Exam: BP (!) 154/78   Pulse 81  Ht 5\' 9"  (1.753 m)   Wt 255 lb 2 oz (115.7 kg)   BMI 37.68 kg/m   Constitutional:  Alert and oriented, No acute distress. HEENT: [place] AT, moist mucus membranes.  Trachea midline, no masses. Cardiovascular: No clubbing, cyanosis, or edema. Respiratory: Normal respiratory effort, no increased work of breathing. GI: Abdomen is soft, nontender, nondistended, no abdominal masses GU: No CVA tenderness.  Lymph: No cervical or inguinal lymphadenopathy. Skin: No rashes, bruises or suspicious lesions. Neurologic: Grossly intact, no focal deficits, moving all 4 extremities. Psychiatric: Normal mood and affect.  Laboratory Data: Lab Results  Component Value Date   WBC 7.4 08/26/2020   HGB  11.2 (L) 08/26/2020   HCT 35.8 (L) 08/26/2020   MCV 92.0 08/26/2020   PLT 287 08/26/2020    Lab Results  Component Value Date   CREATININE 1.17 08/26/2020    Lab Results  Component Value Date   PSA 2.22 10/18/2007   PSA 1.51 05/08/2006   PSA NORMAL 03/16/2005    No results found for: TESTOSTERONE  No results found for: HGBA1C  Urinalysis    Component Value Date/Time   COLORURINE RED (A) 10/05/2020 2219   APPEARANCEUR CLOUDY (A) 10/05/2020 2219   APPEARANCEUR Cloudy (A) 03/18/2020 1435   LABSPEC 1.013 10/05/2020 2219   PHURINE 6.0 10/05/2020 2219   GLUCOSEU NEGATIVE 10/05/2020 2219   HGBUR MODERATE (A) 10/05/2020 2219   HGBUR negative 07/19/2007 0918   BILIRUBINUR NEGATIVE 10/05/2020 2219   BILIRUBINUR Negative 03/18/2020 1435   KETONESUR NEGATIVE 10/05/2020 2219   PROTEINUR 100 (A) 10/05/2020 2219   UROBILINOGEN 0.2 12/13/2019 1115   UROBILINOGEN 1.0 07/19/2007 0918   NITRITE NEGATIVE 10/05/2020 2219   LEUKOCYTESUR TRACE (A) 10/05/2020 2219    Lab Results  Component Value Date   LABMICR See below: 03/18/2020   WBCUA >30 (A) 03/18/2020   LABEPIT 0-10 03/18/2020   BACTERIA RARE (A) 10/05/2020    Pertinent Imaging:  No results found for this or any previous visit.  No results found for this or any previous visit.  No results found for this or any previous visit.  No results found for this or any previous visit.  No results found for this or any previous visit.  No results found for this or any previous visit.  No results found for this or any previous visit.  No results found for this or any previous visit.   Assessment & Plan:    1. Urinary retention -Continue flomax 0.8mg . RTC 1 week for voiding trial   Return in about 1 week (around 10/14/2020) for voiding trial.  Nicolette Bang, MD  [facility] [place]

## 2020-10-23 ENCOUNTER — Ambulatory Visit (INDEPENDENT_AMBULATORY_CARE_PROVIDER_SITE_OTHER): Payer: Medicare HMO

## 2020-10-23 ENCOUNTER — Other Ambulatory Visit: Payer: Self-pay

## 2020-10-23 DIAGNOSIS — R339 Retention of urine, unspecified: Secondary | ICD-10-CM | POA: Diagnosis not present

## 2020-10-23 LAB — URINALYSIS, ROUTINE W REFLEX MICROSCOPIC
Bilirubin, UA: NEGATIVE
Glucose, UA: NEGATIVE
Ketones, UA: NEGATIVE
Nitrite, UA: POSITIVE — AB
Specific Gravity, UA: 1.02 (ref 1.005–1.030)
Urobilinogen, Ur: 0.2 mg/dL (ref 0.2–1.0)
pH, UA: 5.5 (ref 5.0–7.5)

## 2020-10-23 LAB — MICROSCOPIC EXAMINATION
Renal Epithel, UA: NONE SEEN /hpf
WBC, UA: >30 /hpf — AB (ref 0–5)

## 2020-10-23 NOTE — Addendum Note (Signed)
Addended by: Iris Pert on: 10/23/2020 09:06 AM   Modules accepted: Orders

## 2020-10-23 NOTE — Patient Instructions (Signed)

## 2020-10-23 NOTE — Progress Notes (Signed)
Patient present in office today complaining of foley not draining.  Foley flushed without difficulty but nor urine return noted.   Cath Change/ Replacement  Patient is present today for a catheter change due to urinary retention.  14ml of water was removed from the balloon, a 16FR foley cath was removed without difficulty. Blood clot noted.  Patient was cleaned and prepped in a sterile fashion with betadine. A 18 FR foley cath was replaced into the bladder no complications were noted Urine return was noted 819ml and urine was red tinged in color. The balloon was filled with 25ml of sterile water. A leg bag was attached for drainage.  Patient was given proper instruction on catheter care.    Urine sent for culture  Performed by: Lileigh Fahringer LPN  Follow up: Keep next scheduled appt.

## 2020-10-25 LAB — URINE CULTURE

## 2020-10-28 ENCOUNTER — Telehealth: Payer: Self-pay

## 2020-10-28 NOTE — Telephone Encounter (Signed)
Tried to call  no able to lvm.

## 2020-10-28 NOTE — Telephone Encounter (Signed)
-----   Message from Cleon Gustin, MD sent at 10/28/2020  8:27 AM EDT ----- Maxcrobid 100mg  BID for 7 days ----- Message ----- From: Mardelle Matte, CMA Sent: 10/26/2020   8:49 AM EDT To: Cleon Gustin, MD  Please review

## 2020-11-03 DIAGNOSIS — C61 Malignant neoplasm of prostate: Secondary | ICD-10-CM

## 2020-11-03 HISTORY — DX: Malignant neoplasm of prostate: C61

## 2020-11-05 DIAGNOSIS — N390 Urinary tract infection, site not specified: Secondary | ICD-10-CM | POA: Diagnosis not present

## 2020-11-05 DIAGNOSIS — R8290 Unspecified abnormal findings in urine: Secondary | ICD-10-CM | POA: Diagnosis not present

## 2020-11-06 ENCOUNTER — Ambulatory Visit: Payer: Medicare HMO | Admitting: Urology

## 2020-11-09 DIAGNOSIS — M4854XD Collapsed vertebra, not elsewhere classified, thoracic region, subsequent encounter for fracture with routine healing: Secondary | ICD-10-CM | POA: Diagnosis not present

## 2020-11-09 DIAGNOSIS — M84461A Pathological fracture, right tibia, initial encounter for fracture: Secondary | ICD-10-CM | POA: Diagnosis not present

## 2020-11-09 DIAGNOSIS — M48061 Spinal stenosis, lumbar region without neurogenic claudication: Secondary | ICD-10-CM | POA: Diagnosis not present

## 2020-11-09 DIAGNOSIS — C9000 Multiple myeloma not having achieved remission: Secondary | ICD-10-CM | POA: Diagnosis not present

## 2020-11-09 DIAGNOSIS — R911 Solitary pulmonary nodule: Secondary | ICD-10-CM | POA: Diagnosis not present

## 2020-11-09 DIAGNOSIS — M5031 Other cervical disc degeneration,  high cervical region: Secondary | ICD-10-CM | POA: Diagnosis not present

## 2020-11-09 DIAGNOSIS — K5730 Diverticulosis of large intestine without perforation or abscess without bleeding: Secondary | ICD-10-CM | POA: Diagnosis not present

## 2020-11-09 DIAGNOSIS — R0789 Other chest pain: Secondary | ICD-10-CM | POA: Diagnosis not present

## 2020-11-09 DIAGNOSIS — Z79899 Other long term (current) drug therapy: Secondary | ICD-10-CM | POA: Diagnosis not present

## 2020-11-09 DIAGNOSIS — R918 Other nonspecific abnormal finding of lung field: Secondary | ICD-10-CM | POA: Diagnosis not present

## 2020-11-09 DIAGNOSIS — S22009A Unspecified fracture of unspecified thoracic vertebra, initial encounter for closed fracture: Secondary | ICD-10-CM | POA: Diagnosis not present

## 2020-11-09 DIAGNOSIS — M8448XA Pathological fracture, other site, initial encounter for fracture: Secondary | ICD-10-CM | POA: Diagnosis not present

## 2020-11-09 DIAGNOSIS — N289 Disorder of kidney and ureter, unspecified: Secondary | ICD-10-CM | POA: Diagnosis not present

## 2020-11-09 DIAGNOSIS — R079 Chest pain, unspecified: Secondary | ICD-10-CM | POA: Diagnosis not present

## 2020-11-09 DIAGNOSIS — N138 Other obstructive and reflux uropathy: Secondary | ICD-10-CM | POA: Diagnosis not present

## 2020-11-09 DIAGNOSIS — C7951 Secondary malignant neoplasm of bone: Secondary | ICD-10-CM | POA: Diagnosis not present

## 2020-11-09 DIAGNOSIS — Z79891 Long term (current) use of opiate analgesic: Secondary | ICD-10-CM | POA: Diagnosis not present

## 2020-11-09 DIAGNOSIS — N2889 Other specified disorders of kidney and ureter: Secondary | ICD-10-CM | POA: Diagnosis not present

## 2020-11-09 DIAGNOSIS — M25551 Pain in right hip: Secondary | ICD-10-CM | POA: Diagnosis not present

## 2020-11-09 DIAGNOSIS — R109 Unspecified abdominal pain: Secondary | ICD-10-CM | POA: Diagnosis not present

## 2020-11-09 DIAGNOSIS — M84462A Pathological fracture, left tibia, initial encounter for fracture: Secondary | ICD-10-CM | POA: Diagnosis not present

## 2020-11-09 DIAGNOSIS — I498 Other specified cardiac arrhythmias: Secondary | ICD-10-CM | POA: Diagnosis not present

## 2020-11-09 DIAGNOSIS — N179 Acute kidney failure, unspecified: Secondary | ICD-10-CM | POA: Diagnosis not present

## 2020-11-09 DIAGNOSIS — N401 Enlarged prostate with lower urinary tract symptoms: Secondary | ICD-10-CM | POA: Diagnosis not present

## 2020-11-09 DIAGNOSIS — N183 Chronic kidney disease, stage 3 unspecified: Secondary | ICD-10-CM | POA: Diagnosis not present

## 2020-11-09 DIAGNOSIS — M8450XA Pathological fracture in neoplastic disease, unspecified site, initial encounter for fracture: Secondary | ICD-10-CM | POA: Diagnosis not present

## 2020-11-09 DIAGNOSIS — D72829 Elevated white blood cell count, unspecified: Secondary | ICD-10-CM | POA: Diagnosis not present

## 2020-11-09 DIAGNOSIS — D649 Anemia, unspecified: Secondary | ICD-10-CM | POA: Diagnosis not present

## 2020-11-09 DIAGNOSIS — R0600 Dyspnea, unspecified: Secondary | ICD-10-CM | POA: Diagnosis not present

## 2020-11-09 DIAGNOSIS — C7989 Secondary malignant neoplasm of other specified sites: Secondary | ICD-10-CM | POA: Diagnosis not present

## 2020-11-09 DIAGNOSIS — I249 Acute ischemic heart disease, unspecified: Secondary | ICD-10-CM | POA: Diagnosis not present

## 2020-11-09 DIAGNOSIS — M79604 Pain in right leg: Secondary | ICD-10-CM | POA: Diagnosis not present

## 2020-11-09 DIAGNOSIS — M549 Dorsalgia, unspecified: Secondary | ICD-10-CM | POA: Diagnosis not present

## 2020-11-09 DIAGNOSIS — M25552 Pain in left hip: Secondary | ICD-10-CM | POA: Diagnosis not present

## 2020-11-09 DIAGNOSIS — C61 Malignant neoplasm of prostate: Secondary | ICD-10-CM | POA: Diagnosis not present

## 2020-11-09 DIAGNOSIS — C799 Secondary malignant neoplasm of unspecified site: Secondary | ICD-10-CM | POA: Diagnosis not present

## 2020-11-09 DIAGNOSIS — I700 Atherosclerosis of aorta: Secondary | ICD-10-CM | POA: Diagnosis not present

## 2020-11-09 DIAGNOSIS — N139 Obstructive and reflux uropathy, unspecified: Secondary | ICD-10-CM | POA: Diagnosis not present

## 2020-11-09 DIAGNOSIS — Z960 Presence of urogenital implants: Secondary | ICD-10-CM | POA: Diagnosis not present

## 2020-11-09 DIAGNOSIS — I10 Essential (primary) hypertension: Secondary | ICD-10-CM | POA: Diagnosis not present

## 2020-11-09 DIAGNOSIS — R5381 Other malaise: Secondary | ICD-10-CM | POA: Diagnosis not present

## 2020-11-09 DIAGNOSIS — R339 Retention of urine, unspecified: Secondary | ICD-10-CM | POA: Diagnosis not present

## 2020-11-09 DIAGNOSIS — I129 Hypertensive chronic kidney disease with stage 1 through stage 4 chronic kidney disease, or unspecified chronic kidney disease: Secondary | ICD-10-CM | POA: Diagnosis not present

## 2020-11-09 DIAGNOSIS — Z87311 Personal history of (healed) other pathological fracture: Secondary | ICD-10-CM | POA: Diagnosis not present

## 2020-11-09 DIAGNOSIS — D631 Anemia in chronic kidney disease: Secondary | ICD-10-CM | POA: Diagnosis not present

## 2020-11-10 DIAGNOSIS — I10 Essential (primary) hypertension: Secondary | ICD-10-CM | POA: Diagnosis not present

## 2020-11-10 DIAGNOSIS — R5381 Other malaise: Secondary | ICD-10-CM | POA: Diagnosis not present

## 2020-11-10 DIAGNOSIS — C7989 Secondary malignant neoplasm of other specified sites: Secondary | ICD-10-CM | POA: Diagnosis not present

## 2020-11-10 DIAGNOSIS — Z79899 Other long term (current) drug therapy: Secondary | ICD-10-CM | POA: Diagnosis not present

## 2020-11-10 DIAGNOSIS — C61 Malignant neoplasm of prostate: Secondary | ICD-10-CM | POA: Diagnosis not present

## 2020-11-10 DIAGNOSIS — N2889 Other specified disorders of kidney and ureter: Secondary | ICD-10-CM | POA: Diagnosis not present

## 2020-11-10 DIAGNOSIS — M4854XD Collapsed vertebra, not elsewhere classified, thoracic region, subsequent encounter for fracture with routine healing: Secondary | ICD-10-CM | POA: Diagnosis not present

## 2020-11-11 DIAGNOSIS — M84462A Pathological fracture, left tibia, initial encounter for fracture: Secondary | ICD-10-CM | POA: Diagnosis not present

## 2020-11-11 DIAGNOSIS — M25552 Pain in left hip: Secondary | ICD-10-CM | POA: Diagnosis not present

## 2020-11-11 DIAGNOSIS — C61 Malignant neoplasm of prostate: Secondary | ICD-10-CM | POA: Diagnosis not present

## 2020-11-11 DIAGNOSIS — M25551 Pain in right hip: Secondary | ICD-10-CM | POA: Diagnosis not present

## 2020-11-11 DIAGNOSIS — M84461A Pathological fracture, right tibia, initial encounter for fracture: Secondary | ICD-10-CM | POA: Diagnosis not present

## 2020-11-11 DIAGNOSIS — R0789 Other chest pain: Secondary | ICD-10-CM | POA: Diagnosis not present

## 2020-11-11 DIAGNOSIS — M48061 Spinal stenosis, lumbar region without neurogenic claudication: Secondary | ICD-10-CM | POA: Diagnosis not present

## 2020-11-11 DIAGNOSIS — M5031 Other cervical disc degeneration,  high cervical region: Secondary | ICD-10-CM | POA: Diagnosis not present

## 2020-11-12 DIAGNOSIS — M4854XD Collapsed vertebra, not elsewhere classified, thoracic region, subsequent encounter for fracture with routine healing: Secondary | ICD-10-CM | POA: Diagnosis not present

## 2020-11-12 DIAGNOSIS — Z79899 Other long term (current) drug therapy: Secondary | ICD-10-CM | POA: Diagnosis not present

## 2020-11-12 DIAGNOSIS — C61 Malignant neoplasm of prostate: Secondary | ICD-10-CM | POA: Diagnosis not present

## 2020-11-12 DIAGNOSIS — C7989 Secondary malignant neoplasm of other specified sites: Secondary | ICD-10-CM | POA: Diagnosis not present

## 2020-11-16 ENCOUNTER — Encounter: Payer: Self-pay | Admitting: Internal Medicine

## 2020-11-18 ENCOUNTER — Telehealth: Payer: Self-pay

## 2020-11-18 DIAGNOSIS — K30 Functional dyspepsia: Secondary | ICD-10-CM | POA: Diagnosis not present

## 2020-11-18 DIAGNOSIS — M545 Low back pain, unspecified: Secondary | ICD-10-CM | POA: Diagnosis not present

## 2020-11-18 DIAGNOSIS — R937 Abnormal findings on diagnostic imaging of other parts of musculoskeletal system: Secondary | ICD-10-CM | POA: Diagnosis not present

## 2020-11-18 DIAGNOSIS — M4854XA Collapsed vertebra, not elsewhere classified, thoracic region, initial encounter for fracture: Secondary | ICD-10-CM | POA: Diagnosis not present

## 2020-11-18 DIAGNOSIS — N403 Nodular prostate with lower urinary tract symptoms: Secondary | ICD-10-CM | POA: Diagnosis not present

## 2020-11-18 DIAGNOSIS — R972 Elevated prostate specific antigen [PSA]: Secondary | ICD-10-CM | POA: Diagnosis not present

## 2020-11-18 DIAGNOSIS — I10 Essential (primary) hypertension: Secondary | ICD-10-CM | POA: Diagnosis not present

## 2020-11-18 DIAGNOSIS — N1831 Chronic kidney disease, stage 3a: Secondary | ICD-10-CM | POA: Diagnosis not present

## 2020-11-18 DIAGNOSIS — Z6838 Body mass index (BMI) 38.0-38.9, adult: Secondary | ICD-10-CM | POA: Diagnosis not present

## 2020-11-18 NOTE — Telephone Encounter (Signed)
-----   Message from Cleon Gustin, MD sent at 11/18/2020  2:20 PM EST ----- Please schedule him for prostate biopsy 11/23

## 2020-11-18 NOTE — Telephone Encounter (Signed)
I called the patient. No answer. No way to leave message.

## 2020-11-23 ENCOUNTER — Ambulatory Visit (INDEPENDENT_AMBULATORY_CARE_PROVIDER_SITE_OTHER): Payer: Medicare HMO

## 2020-11-23 ENCOUNTER — Other Ambulatory Visit: Payer: Self-pay

## 2020-11-23 DIAGNOSIS — M810 Age-related osteoporosis without current pathological fracture: Secondary | ICD-10-CM | POA: Diagnosis not present

## 2020-11-23 DIAGNOSIS — M8589 Other specified disorders of bone density and structure, multiple sites: Secondary | ICD-10-CM | POA: Diagnosis not present

## 2020-11-23 DIAGNOSIS — R972 Elevated prostate specific antigen [PSA]: Secondary | ICD-10-CM | POA: Diagnosis not present

## 2020-11-23 MED ORDER — LEVOFLOXACIN 750 MG PO TABS: 750.0000 mg | ORAL_TABLET | Freq: Once | ORAL | 0 refills | Status: AC

## 2020-11-23 NOTE — Patient Instructions (Addendum)
   Appointment Time:arrive at 12:30 Appointment Date: 11/25/2020  Location: [facility] Radiology Department   Prostate Biopsy Instructions  Stop all aspirin or blood thinners (aspirin, plavix, coumadin, warfarin, motrin, ibuprofen, advil, aleve, naproxen, naprosyn) for 7 days prior to the procedure.  If you have any questions about stopping these medications, please contact your primary care physician or cardiologist.  Having a light meal prior to the procedure is recommended.  If you are diabetic or have low blood sugar please bring a small snack or glucose tablet.  A Fleets enema is needed to be purchased over the counter at a local pharmacy and used 2 hours before you scheduled appointment.  This can be purchased over the counter at any pharmacy.  Antibiotics will be administered in the clinic at the time of the procedure and 1 tablet has been sent to your pharmacy. Please take the antibiotic as prescribed.    Please bring someone with you to the procedure to drive you home if you are given a valium to take prior to your procedure.   If you have any questions or concerns, please feel free to call the office at [phone] or send a Mychart message.    Thank you, [facility]

## 2020-11-23 NOTE — Progress Notes (Signed)
Patient came to office today and prostate biopsy instructions reviewed with patient. Patient voiced understanding.  Scheduled for 11/23 at [facility]. Patient has held his Eliquis per his PCP.

## 2020-11-23 NOTE — Telephone Encounter (Signed)
I called and spoke with patient this am. Patient will come to office today to discuss prostate biopsy procedure and review instructions.  Patient scheduled for 11/25/2020. Per patient his PCP held his eliquis starting today.

## 2020-11-25 ENCOUNTER — Ambulatory Visit (HOSPITAL_COMMUNITY)
Admission: RE | Admit: 2020-11-25 | Discharge: 2020-11-25 | Disposition: A | Discharge status: Home / Self Care | Payer: Medicare HMO | Source: Ambulatory Visit | Attending: Urology | Admitting: Urology

## 2020-11-25 ENCOUNTER — Ambulatory Visit: Payer: Medicare HMO | Admitting: Urology

## 2020-11-25 ENCOUNTER — Encounter: Payer: Self-pay | Admitting: Urology

## 2020-11-25 ENCOUNTER — Ambulatory Visit: Payer: Medicare HMO

## 2020-11-25 ENCOUNTER — Other Ambulatory Visit: Payer: Self-pay

## 2020-11-25 ENCOUNTER — Ambulatory Visit (HOSPITAL_BASED_OUTPATIENT_CLINIC_OR_DEPARTMENT_OTHER): Payer: Medicare HMO | Admitting: Urology

## 2020-11-25 ENCOUNTER — Other Ambulatory Visit: Payer: Self-pay | Admitting: Urology

## 2020-11-25 ENCOUNTER — Encounter (HOSPITAL_COMMUNITY): Payer: Self-pay

## 2020-11-25 DIAGNOSIS — C61 Malignant neoplasm of prostate: Secondary | ICD-10-CM | POA: Diagnosis not present

## 2020-11-25 DIAGNOSIS — R972 Elevated prostate specific antigen [PSA]: Secondary | ICD-10-CM | POA: Diagnosis not present

## 2020-11-25 DIAGNOSIS — R339 Retention of urine, unspecified: Secondary | ICD-10-CM

## 2020-11-25 MED ORDER — LIDOCAINE HCL (PF) 2 % IJ SOLN: 10.0000 mL | Freq: Once | INTRAMUSCULAR | Status: AC

## 2020-11-25 MED ORDER — LIDOCAINE HCL (PF) 2 % IJ SOLN
INTRAMUSCULAR | Status: AC
  Filled 2020-11-25: qty 10

## 2020-11-25 MED ORDER — LIDOCAINE HCL (PF) 2 % IJ SOLN
INTRAMUSCULAR | Status: AC
  Administered 2020-11-25: 10 mL
  Filled 2020-11-25: qty 10

## 2020-11-25 MED ORDER — GENTAMICIN SULFATE 40 MG/ML IJ SOLN: 80.0000 mg | Freq: Once | INTRAMUSCULAR | Status: AC

## 2020-11-25 MED ORDER — GENTAMICIN SULFATE 40 MG/ML IJ SOLN
INTRAMUSCULAR | Status: AC
  Administered 2020-11-25: 80 mg via INTRAMUSCULAR
  Filled 2020-11-25: qty 2

## 2020-11-25 NOTE — Progress Notes (Signed)
PT tolerated prostate biopsy procedure and antibiotic injection well today. Labs obtained and sent for pathology. PT ambulatory at discharge with no acute distress noted and verbalized understanding of discharge instructions. PT to follow up with urologist as scheduled on 12/04/20 at 1:00pm.

## 2020-11-25 NOTE — Patient Instructions (Signed)
Transrectal Ultrasound-Guided Prostate Biopsy, Care After What can I expect after the procedure? After the procedure, it is common to have: Pain and discomfort near your butt (rectum), especially while sitting. Pink-colored pee (urine). This is due to small amounts of blood in your pee. A burning feeling while peeing. Blood in your poop (stool). Bleeding from your butt. Blood in your semen. Follow these instructions at home: Medicines Take over-the-counter and prescription medicines only as told by your doctor. If you were given a sedative during your procedure, do not drive or use machines until your doctor says that it is safe. A sedative is a medicine that helps you relax. If you were prescribed an antibiotic medicine, take it as told by your doctor. Do not stop taking it even if you start to feel better. Activity  Return to your normal activities when your doctor says that it is safe. Ask your doctor when it is okay for you to have sex. You may have to avoid lifting. Ask your doctor how much you can safely lift. General instructions  Drink enough water to keep your pee pale yellow. Watch your pee, poop, and semen for new bleeding or bleeding that gets worse. Keep all follow-up visits. Contact a doctor if: You have any of these: Blood clots in your pee or poop. Blood in your pee more than 2 weeks after the procedure. Blood in your semen more than 2 months after the procedure. New or worse bleeding in your pee, poop, or semen. Very bad belly pain. Your pee smells bad or unusual. You have trouble peeing. Your lower belly feels firm. You have problems getting an erection. You feel like you may vomit (are nauseous), or you vomit. Get help right away if: You have a fever or chills. You have bright red pee. You have very bad pain that does not get better with medicine. You cannot pee. Summary After this procedure, it is common to have pain and discomfort near your butt,  especially while sitting. You may have blood in your pee and poop. It is common to have blood in your semen. Get help right away if you have a fever or chills. This information is not intended to replace advice given to you by your health care provider. Make sure you discuss any questions you have with your health care provider. Document Revised: 06/15/2020 Document Reviewed: 06/15/2020 Elsevier Patient Education  [place].

## 2020-11-25 NOTE — Progress Notes (Signed)
Prostate Biopsy Procedure   Informed consent was obtained after discussing risks/benefits of the procedure.  A time out was performed to ensure correct patient identity.  Pre-Procedure: - Last PSA Level:  Lab Results  Component Value Date   PSA 2.22 10/18/2007   PSA 1.51 05/08/2006   PSA NORMAL 03/16/2005   - Gentamicin given prophylactically - Levaquin 500 mg administered PO -Transrectal Ultrasound performed revealing a 147.2 gm prostate -No significant hypoechoic or median lobe noted  Procedure: - Prostate block performed using 10 cc 1% lidocaine and biopsies taken from sextant areas, a total of 12 under ultrasound guidance.  Post-Procedure: - Patient tolerated the procedure well - He was counseled to seek immediate medical attention if experiences any severe pain, significant bleeding, or fevers - Return in one week to discuss biopsy results

## 2020-11-27 DIAGNOSIS — C7982 Secondary malignant neoplasm of genital organs: Secondary | ICD-10-CM | POA: Diagnosis not present

## 2020-11-27 DIAGNOSIS — I959 Hypotension, unspecified: Secondary | ICD-10-CM | POA: Diagnosis not present

## 2020-11-27 DIAGNOSIS — R079 Chest pain, unspecified: Secondary | ICD-10-CM | POA: Diagnosis not present

## 2020-11-27 DIAGNOSIS — I10 Essential (primary) hypertension: Secondary | ICD-10-CM | POA: Diagnosis not present

## 2020-11-27 DIAGNOSIS — R0789 Other chest pain: Secondary | ICD-10-CM | POA: Diagnosis not present

## 2020-11-27 DIAGNOSIS — G4489 Other headache syndrome: Secondary | ICD-10-CM | POA: Diagnosis not present

## 2020-11-27 DIAGNOSIS — R0689 Other abnormalities of breathing: Secondary | ICD-10-CM | POA: Diagnosis not present

## 2020-11-27 DIAGNOSIS — C61 Malignant neoplasm of prostate: Secondary | ICD-10-CM | POA: Diagnosis not present

## 2020-11-27 DIAGNOSIS — Z86718 Personal history of other venous thrombosis and embolism: Secondary | ICD-10-CM | POA: Diagnosis not present

## 2020-11-30 DIAGNOSIS — R972 Elevated prostate specific antigen [PSA]: Secondary | ICD-10-CM | POA: Diagnosis not present

## 2020-11-30 DIAGNOSIS — M545 Low back pain, unspecified: Secondary | ICD-10-CM | POA: Diagnosis not present

## 2020-11-30 DIAGNOSIS — C7951 Secondary malignant neoplasm of bone: Secondary | ICD-10-CM | POA: Diagnosis not present

## 2020-11-30 DIAGNOSIS — C799 Secondary malignant neoplasm of unspecified site: Secondary | ICD-10-CM | POA: Diagnosis not present

## 2020-11-30 DIAGNOSIS — R937 Abnormal findings on diagnostic imaging of other parts of musculoskeletal system: Secondary | ICD-10-CM | POA: Diagnosis not present

## 2020-11-30 DIAGNOSIS — N134 Hydroureter: Secondary | ICD-10-CM | POA: Diagnosis not present

## 2020-11-30 DIAGNOSIS — M4854XA Collapsed vertebra, not elsewhere classified, thoracic region, initial encounter for fracture: Secondary | ICD-10-CM | POA: Diagnosis not present

## 2020-12-01 DIAGNOSIS — Z6837 Body mass index (BMI) 37.0-37.9, adult: Secondary | ICD-10-CM | POA: Diagnosis not present

## 2020-12-01 DIAGNOSIS — R079 Chest pain, unspecified: Secondary | ICD-10-CM | POA: Diagnosis not present

## 2020-12-01 DIAGNOSIS — C61 Malignant neoplasm of prostate: Secondary | ICD-10-CM | POA: Diagnosis not present

## 2020-12-02 ENCOUNTER — Ambulatory Visit: Payer: Medicare HMO | Admitting: Gastroenterology

## 2020-12-04 ENCOUNTER — Ambulatory Visit: Payer: Medicare HMO | Admitting: Urology

## 2020-12-04 ENCOUNTER — Encounter: Payer: Self-pay | Admitting: Urology

## 2020-12-04 ENCOUNTER — Other Ambulatory Visit: Payer: Self-pay

## 2020-12-04 VITALS — BP 183/75 | HR 92

## 2020-12-04 DIAGNOSIS — C61 Malignant neoplasm of prostate: Secondary | ICD-10-CM

## 2020-12-04 NOTE — Patient Instructions (Signed)
Prostate Cancer °The prostate is a small gland that helps make semen. It is located below a man's bladder, in front of the rectum. Prostate cancer is when abnormal cells grow in this gland. °What are the causes? °The cause of this condition is not known. °What increases the risk? °Being age 73 or older. °Having a family history of prostate cancer. °Having a family history of cancer of the breasts or ovaries. °Having genes that are passed from parent to child (inherited). °Having Lynch syndrome. °African American men and men of African descent are diagnosed with prostate cancer at higher rates than other men. °What are the signs or symptoms? °Problems peeing (urinating). This may include: °A stream that is weak, or pee that stops and starts. °Trouble starting or stopping your pee. °Trouble emptying all of your pee. °Needing to pee more often, especially at night. °Blood in your pee or semen. °Pain in the: °Lower back. °Lower belly (abdomen). °Hips. °Trouble getting an erection. °Weakness or numbness in the legs or feet. °How is this treated? °Treatment for this condition depends on: °How much the cancer has spread. °Your age. °The kind of treatment you want. °Your health. °Treatments include: °Being watched. This is called observation. You will be tested from time to time, but you will not get treated. Tests are to make sure that the cancer is not growing. °Surgery. This may be done to: °Take out (remove) the prostate. °Freeze and kill cancer cells. °Radiation. This uses a strong beam of energy to kill cancer cells. °Chemotherapy. This uses medicines that stop cancer cells from increasing. This kills cancer cells and healthy cells. °Targeted therapy. This kills cancer cells only. Healthy cells are not affected. °Hormone treatment. This stops the body from making hormones that help the cancer cells grow. °Follow these instructions at home: °Lifestyle °Do not smoke or use any products that contain nicotine or tobacco.  If you need help quitting, ask your doctor. °Eat a healthy diet. °Treatment may affect your ability to have sex. If you have a partner, touch, hold, hug, and caress your partner to have intimate moments. °Get plenty of sleep. °Ask your doctor for help to find a support group for men with prostate cancer. °General instructions °Take over-the-counter and prescription medicines only as told by your doctor. °If you have to go to the hospital, let your cancer doctor (oncologist) know. °Keep all follow-up visits. °Where to find more information °American Cancer Society: www.cancer.org °American Society of Clinical Oncology: www.cancer.net °National Cancer Institute: www.cancer.gov °Contact a doctor if: °You have new or more trouble peeing. °You have new or more blood in your pee. °You have new or more pain in your hips, back, or chest. °Get help right away if: °You have weakness in your legs. °You lose feeling in your legs. °You cannot control your pee or your poop (stool). °You have chills or a fever. °Summary °The prostate is a male gland that helps make semen. °Prostate cancer is when abnormal cells grow in this gland. °Treatment includes doing surgery, using medicines, using strong beams of energy, or watching without treatment. °Ask your doctor for help to find a support group for men with prostate cancer. °Contact a doctor if you have problems peeing or have any new pain that you did not have before. °This information is not intended to replace advice given to you by your health care provider. Make sure you discuss any questions you have with your health care provider. °Document Revised: 03/18/2020 Document Reviewed: 03/18/2020 °Elsevier   Patient Education © 2022 Elsevier Inc. ° °

## 2020-12-04 NOTE — Progress Notes (Signed)
12/04/2020 1:26 PM   Johnny Fischer 1945-01-07 [id number]  Referring provider: Neale Burly, MD [address] [place],  [place] [address]  Followup prostate biopsy   HPI: Johnny Fischer is a 75yo here for followup after prostate biopsy. Prostate biopsy revelaed high volume Gleason 4+5=9 and 5+4=9 in 6/6 cores. PSA 1400. Prostate volume 140cc. He currently has an indwelling foley due to issues with urinary retention.    PMH: Past Medical History:  Diagnosis Date   Arthritis    DVT (deep vein thrombosis) 12/2019   Hypertension     Surgical History: Past Surgical History:  Procedure Laterality Date   CYSTOSCOPY N/A 05/04/2020   Procedure: CYSTOSCOPY;  Surgeon: Cleon Gustin, MD;  Location: [facility];  Service: Urology;  Laterality: N/A;   CYSTOSCOPY WITH INSERTION OF UROLIFT N/A 05/04/2020   Procedure: CYSTOSCOPY WITH INSERTION OF UROLIFT;  Surgeon: Cleon Gustin, MD;  Location: [facility];  Service: Urology;  Laterality: N/A;   CYSTOSCOPY WITH INSERTION OF UROLIFT N/A 10/05/2020   Procedure: CYSTOSCOPY WITH INSERTION OF UROLIFT;  Surgeon: Cleon Gustin, MD;  Location: [facility];  Service: Urology;  Laterality: N/A;   JOINT REPLACEMENT     TOTAL KNEE ARTHROPLASTY Right     Home Medications:  Allergies as of 12/04/2020   No Known Allergies      Medication List        Accurate as of December 04, 2020  1:26 PM. If you have any questions, ask your nurse or doctor.          amLODipine 10 MG tablet Commonly known as: NORVASC Take 10 mg by mouth daily.   benazepril 10 MG tablet Commonly known as: LOTENSIN Take 10 mg by mouth daily.   carvedilol 12.5 MG tablet Commonly known as: COREG Take 12.5 mg by mouth 2 (two) times daily.   cephALEXin 500 MG capsule Commonly known as: KEFLEX Take 1 capsule (500 mg total) by mouth 3 (three) times daily.   chlorthalidone 25 MG tablet Commonly known as: HYGROTON Take 25 mg by mouth daily.   Eliquis 5 MG Tabs  tablet Generic drug: apixaban Take 5 mg by mouth 2 (two) times daily.   furosemide 40 MG tablet Commonly known as: LASIX Take 40 mg by mouth daily.   losartan 100 MG tablet Commonly known as: COZAAR Take 100 mg by mouth daily.   olmesartan 40 MG tablet Commonly known as: BENICAR Take 40 mg by mouth daily.   spironolactone 25 MG tablet Commonly known as: ALDACTONE Take 25 mg by mouth 2 (two) times daily.   sulfamethoxazole-trimethoprim 800-160 MG tablet Commonly known as: BACTRIM DS Take 1 tablet by mouth every 12 (twelve) hours.   tamsulosin 0.4 MG Caps capsule Commonly known as: FLOMAX Take 1 capsule (0.4 mg total) by mouth daily. What changed: how much to take   traMADol 50 MG tablet Commonly known as: Ultram Take 1 tablet (50 mg total) by mouth every 6 (six) hours as needed.        Allergies: No Known Allergies  Family History: Family History  Family history unknown: Yes    Social History:  reports that he has never smoked. He has never used smokeless tobacco. He reports that he does not currently use alcohol. He reports that he does not use drugs.  ROS: All other review of systems were reviewed and are negative except what is noted above in HPI  Physical Exam: BP (!) 183/75   Pulse 92  Constitutional:  Alert and oriented, No acute distress. HEENT: [place] AT, moist mucus membranes.  Trachea midline, no masses. Cardiovascular: No clubbing, cyanosis, or edema. Respiratory: Normal respiratory effort, no increased work of breathing. GI: Abdomen is soft, nontender, nondistended, no abdominal masses GU: No CVA tenderness.  Lymph: No cervical or inguinal lymphadenopathy. Skin: No rashes, bruises or suspicious lesions. Neurologic: Grossly intact, no focal deficits, moving all 4 extremities. Psychiatric: Normal mood and affect.  Laboratory Data: Lab Results  Component Value Date   WBC 7.4 08/26/2020   HGB 11.2 (L) 08/26/2020   HCT 35.8 (L) 08/26/2020   MCV  92.0 08/26/2020   PLT 287 08/26/2020    Lab Results  Component Value Date   CREATININE 1.17 08/26/2020    Lab Results  Component Value Date   PSA 2.22 10/18/2007   PSA 1.51 05/08/2006   PSA NORMAL 03/16/2005    No results found for: TESTOSTERONE  No results found for: HGBA1C  Urinalysis    Component Value Date/Time   COLORURINE RED (A) 10/05/2020 2219   APPEARANCEUR Cloudy (A) 10/23/2020 0917   LABSPEC 1.013 10/05/2020 2219   PHURINE 6.0 10/05/2020 2219   GLUCOSEU Negative 10/23/2020 0917   HGBUR MODERATE (A) 10/05/2020 2219   HGBUR negative 07/19/2007 0918   BILIRUBINUR Negative 10/23/2020 0917   KETONESUR NEGATIVE 10/05/2020 2219   PROTEINUR 2+ (A) 10/23/2020 0917   PROTEINUR 100 (A) 10/05/2020 2219   UROBILINOGEN 0.2 12/13/2019 1115   UROBILINOGEN 1.0 07/19/2007 0918   NITRITE Positive (A) 10/23/2020 0917   NITRITE NEGATIVE 10/05/2020 2219   LEUKOCYTESUR 2+ (A) 10/23/2020 0917   LEUKOCYTESUR TRACE (A) 10/05/2020 2219    Lab Results  Component Value Date   LABMICR See below: 10/23/2020   WBCUA >30 (A) 10/23/2020   LABEPIT 0-10 10/23/2020   BACTERIA Many (A) 10/23/2020    Pertinent Imaging:  No results found for this or any previous visit.  No results found for this or any previous visit.  No results found for this or any previous visit.  No results found for this or any previous visit.  No results found for this or any previous visit.  No results found for this or any previous visit.  No results found for this or any previous visit.  No results found for this or any previous visit.   Assessment & Plan:    1. Prostate cancer [facility]) I discussed the natural history of high risk/metastatic prostate cancer with the patient and the various treatment options including observation with palliative care and ADT, After discussing the options the patient elects for ADT.     No follow-ups on file.  Nicolette Bang, MD  [facility]  [place]

## 2020-12-04 NOTE — Progress Notes (Signed)
1 week post op

## 2020-12-07 DIAGNOSIS — R0789 Other chest pain: Secondary | ICD-10-CM | POA: Diagnosis not present

## 2020-12-10 ENCOUNTER — Ambulatory Visit (INDEPENDENT_AMBULATORY_CARE_PROVIDER_SITE_OTHER): Payer: Medicare HMO

## 2020-12-10 ENCOUNTER — Other Ambulatory Visit: Payer: Self-pay

## 2020-12-10 ENCOUNTER — Telehealth: Payer: Self-pay

## 2020-12-10 DIAGNOSIS — C61 Malignant neoplasm of prostate: Secondary | ICD-10-CM

## 2020-12-10 MED ORDER — DEGARELIX ACETATE(240 MG DOSE) 120 MG/VIAL ~~LOC~~ SOLR
240.0000 mg | Freq: Once | SUBCUTANEOUS | Status: AC
  Administered 2020-12-10: 240 mg via SUBCUTANEOUS

## 2020-12-10 NOTE — Progress Notes (Signed)
Firmagon Sub Q Injection  Due to Prostate Cancer patient is present today for a Firmagon Injection.   Medication: Firmagon (Degarelix)  Dose: 240mg  Location: right & left upper abdomen  Patient tolerated well, no complications were noted  Performed by: Gabriellia Rempel LPN   Follow up: Keep next scheduled lab and office visit.

## 2020-12-10 NOTE — Telephone Encounter (Signed)
Johnny Fischer from [facility] called today to verify ADT therapy has been started.  Johnny Fischer notified of Johnny Fischer 240mg  given today and will return in 1 month for Eligard

## 2020-12-11 DIAGNOSIS — G893 Neoplasm related pain (acute) (chronic): Secondary | ICD-10-CM | POA: Diagnosis not present

## 2020-12-11 DIAGNOSIS — C61 Malignant neoplasm of prostate: Secondary | ICD-10-CM | POA: Diagnosis not present

## 2020-12-14 DIAGNOSIS — C61 Malignant neoplasm of prostate: Secondary | ICD-10-CM | POA: Diagnosis not present

## 2020-12-14 DIAGNOSIS — M4854XA Collapsed vertebra, not elsewhere classified, thoracic region, initial encounter for fracture: Secondary | ICD-10-CM | POA: Diagnosis not present

## 2020-12-14 DIAGNOSIS — R339 Retention of urine, unspecified: Secondary | ICD-10-CM | POA: Diagnosis not present

## 2020-12-14 DIAGNOSIS — Z510 Encounter for antineoplastic radiation therapy: Secondary | ICD-10-CM | POA: Diagnosis not present

## 2020-12-14 DIAGNOSIS — C7800 Secondary malignant neoplasm of unspecified lung: Secondary | ICD-10-CM | POA: Diagnosis not present

## 2020-12-14 DIAGNOSIS — M546 Pain in thoracic spine: Secondary | ICD-10-CM | POA: Diagnosis not present

## 2020-12-14 DIAGNOSIS — G893 Neoplasm related pain (acute) (chronic): Secondary | ICD-10-CM | POA: Diagnosis not present

## 2020-12-14 DIAGNOSIS — M5418 Radiculopathy, sacral and sacrococcygeal region: Secondary | ICD-10-CM | POA: Diagnosis not present

## 2020-12-14 DIAGNOSIS — C7951 Secondary malignant neoplasm of bone: Secondary | ICD-10-CM | POA: Diagnosis not present

## 2020-12-16 DIAGNOSIS — C61 Malignant neoplasm of prostate: Secondary | ICD-10-CM | POA: Diagnosis not present

## 2020-12-17 DIAGNOSIS — I517 Cardiomegaly: Secondary | ICD-10-CM | POA: Diagnosis not present

## 2020-12-17 DIAGNOSIS — Z86718 Personal history of other venous thrombosis and embolism: Secondary | ICD-10-CM | POA: Diagnosis not present

## 2020-12-17 DIAGNOSIS — I129 Hypertensive chronic kidney disease with stage 1 through stage 4 chronic kidney disease, or unspecified chronic kidney disease: Secondary | ICD-10-CM | POA: Diagnosis not present

## 2020-12-17 DIAGNOSIS — J9811 Atelectasis: Secondary | ICD-10-CM | POA: Diagnosis not present

## 2020-12-17 DIAGNOSIS — N183 Chronic kidney disease, stage 3 unspecified: Secondary | ICD-10-CM | POA: Diagnosis not present

## 2020-12-17 DIAGNOSIS — C61 Malignant neoplasm of prostate: Secondary | ICD-10-CM | POA: Diagnosis not present

## 2020-12-17 DIAGNOSIS — Z79899 Other long term (current) drug therapy: Secondary | ICD-10-CM | POA: Diagnosis not present

## 2020-12-17 DIAGNOSIS — J811 Chronic pulmonary edema: Secondary | ICD-10-CM | POA: Diagnosis not present

## 2020-12-17 DIAGNOSIS — Z7901 Long term (current) use of anticoagulants: Secondary | ICD-10-CM | POA: Diagnosis not present

## 2020-12-18 DIAGNOSIS — M4854XA Collapsed vertebra, not elsewhere classified, thoracic region, initial encounter for fracture: Secondary | ICD-10-CM | POA: Diagnosis not present

## 2020-12-18 DIAGNOSIS — R339 Retention of urine, unspecified: Secondary | ICD-10-CM | POA: Diagnosis not present

## 2020-12-18 DIAGNOSIS — Z510 Encounter for antineoplastic radiation therapy: Secondary | ICD-10-CM | POA: Diagnosis not present

## 2020-12-18 DIAGNOSIS — M5418 Radiculopathy, sacral and sacrococcygeal region: Secondary | ICD-10-CM | POA: Diagnosis not present

## 2020-12-18 DIAGNOSIS — G893 Neoplasm related pain (acute) (chronic): Secondary | ICD-10-CM | POA: Diagnosis not present

## 2020-12-18 DIAGNOSIS — C7800 Secondary malignant neoplasm of unspecified lung: Secondary | ICD-10-CM | POA: Diagnosis not present

## 2020-12-18 DIAGNOSIS — C7951 Secondary malignant neoplasm of bone: Secondary | ICD-10-CM | POA: Diagnosis not present

## 2020-12-18 DIAGNOSIS — M546 Pain in thoracic spine: Secondary | ICD-10-CM | POA: Diagnosis not present

## 2020-12-18 DIAGNOSIS — C61 Malignant neoplasm of prostate: Secondary | ICD-10-CM | POA: Diagnosis not present

## 2020-12-21 DIAGNOSIS — M546 Pain in thoracic spine: Secondary | ICD-10-CM | POA: Diagnosis not present

## 2020-12-21 DIAGNOSIS — C7800 Secondary malignant neoplasm of unspecified lung: Secondary | ICD-10-CM | POA: Diagnosis not present

## 2020-12-21 DIAGNOSIS — G893 Neoplasm related pain (acute) (chronic): Secondary | ICD-10-CM | POA: Diagnosis not present

## 2020-12-21 DIAGNOSIS — Z510 Encounter for antineoplastic radiation therapy: Secondary | ICD-10-CM | POA: Diagnosis not present

## 2020-12-21 DIAGNOSIS — M4854XA Collapsed vertebra, not elsewhere classified, thoracic region, initial encounter for fracture: Secondary | ICD-10-CM | POA: Diagnosis not present

## 2020-12-21 DIAGNOSIS — C7951 Secondary malignant neoplasm of bone: Secondary | ICD-10-CM | POA: Diagnosis not present

## 2020-12-21 DIAGNOSIS — R339 Retention of urine, unspecified: Secondary | ICD-10-CM | POA: Diagnosis not present

## 2020-12-21 DIAGNOSIS — C61 Malignant neoplasm of prostate: Secondary | ICD-10-CM | POA: Diagnosis not present

## 2020-12-21 DIAGNOSIS — M5418 Radiculopathy, sacral and sacrococcygeal region: Secondary | ICD-10-CM | POA: Diagnosis not present

## 2020-12-22 DIAGNOSIS — M546 Pain in thoracic spine: Secondary | ICD-10-CM | POA: Diagnosis not present

## 2020-12-22 DIAGNOSIS — M5418 Radiculopathy, sacral and sacrococcygeal region: Secondary | ICD-10-CM | POA: Diagnosis not present

## 2020-12-22 DIAGNOSIS — R339 Retention of urine, unspecified: Secondary | ICD-10-CM | POA: Diagnosis not present

## 2020-12-22 DIAGNOSIS — Z510 Encounter for antineoplastic radiation therapy: Secondary | ICD-10-CM | POA: Diagnosis not present

## 2020-12-22 DIAGNOSIS — M4854XA Collapsed vertebra, not elsewhere classified, thoracic region, initial encounter for fracture: Secondary | ICD-10-CM | POA: Diagnosis not present

## 2020-12-22 DIAGNOSIS — C7951 Secondary malignant neoplasm of bone: Secondary | ICD-10-CM | POA: Diagnosis not present

## 2020-12-22 DIAGNOSIS — C7800 Secondary malignant neoplasm of unspecified lung: Secondary | ICD-10-CM | POA: Diagnosis not present

## 2020-12-22 DIAGNOSIS — G893 Neoplasm related pain (acute) (chronic): Secondary | ICD-10-CM | POA: Diagnosis not present

## 2020-12-22 DIAGNOSIS — C61 Malignant neoplasm of prostate: Secondary | ICD-10-CM | POA: Diagnosis not present

## 2020-12-23 DIAGNOSIS — Z510 Encounter for antineoplastic radiation therapy: Secondary | ICD-10-CM | POA: Diagnosis not present

## 2020-12-23 DIAGNOSIS — C7951 Secondary malignant neoplasm of bone: Secondary | ICD-10-CM | POA: Diagnosis not present

## 2020-12-23 DIAGNOSIS — R339 Retention of urine, unspecified: Secondary | ICD-10-CM | POA: Diagnosis not present

## 2020-12-23 DIAGNOSIS — G893 Neoplasm related pain (acute) (chronic): Secondary | ICD-10-CM | POA: Diagnosis not present

## 2020-12-23 DIAGNOSIS — M546 Pain in thoracic spine: Secondary | ICD-10-CM | POA: Diagnosis not present

## 2020-12-23 DIAGNOSIS — C61 Malignant neoplasm of prostate: Secondary | ICD-10-CM | POA: Diagnosis not present

## 2020-12-23 DIAGNOSIS — M4854XA Collapsed vertebra, not elsewhere classified, thoracic region, initial encounter for fracture: Secondary | ICD-10-CM | POA: Diagnosis not present

## 2020-12-23 DIAGNOSIS — C7800 Secondary malignant neoplasm of unspecified lung: Secondary | ICD-10-CM | POA: Diagnosis not present

## 2020-12-23 DIAGNOSIS — M5418 Radiculopathy, sacral and sacrococcygeal region: Secondary | ICD-10-CM | POA: Diagnosis not present

## 2020-12-24 DIAGNOSIS — C7951 Secondary malignant neoplasm of bone: Secondary | ICD-10-CM | POA: Diagnosis not present

## 2020-12-24 DIAGNOSIS — C7800 Secondary malignant neoplasm of unspecified lung: Secondary | ICD-10-CM | POA: Diagnosis not present

## 2020-12-24 DIAGNOSIS — Z510 Encounter for antineoplastic radiation therapy: Secondary | ICD-10-CM | POA: Diagnosis not present

## 2020-12-24 DIAGNOSIS — G893 Neoplasm related pain (acute) (chronic): Secondary | ICD-10-CM | POA: Diagnosis not present

## 2020-12-24 DIAGNOSIS — C61 Malignant neoplasm of prostate: Secondary | ICD-10-CM | POA: Diagnosis not present

## 2020-12-24 DIAGNOSIS — M546 Pain in thoracic spine: Secondary | ICD-10-CM | POA: Diagnosis not present

## 2020-12-24 DIAGNOSIS — M5418 Radiculopathy, sacral and sacrococcygeal region: Secondary | ICD-10-CM | POA: Diagnosis not present

## 2020-12-24 DIAGNOSIS — R339 Retention of urine, unspecified: Secondary | ICD-10-CM | POA: Diagnosis not present

## 2020-12-24 DIAGNOSIS — M4854XA Collapsed vertebra, not elsewhere classified, thoracic region, initial encounter for fracture: Secondary | ICD-10-CM | POA: Diagnosis not present

## 2020-12-29 DIAGNOSIS — M5418 Radiculopathy, sacral and sacrococcygeal region: Secondary | ICD-10-CM | POA: Diagnosis not present

## 2020-12-29 DIAGNOSIS — R339 Retention of urine, unspecified: Secondary | ICD-10-CM | POA: Diagnosis not present

## 2020-12-29 DIAGNOSIS — C61 Malignant neoplasm of prostate: Secondary | ICD-10-CM | POA: Diagnosis not present

## 2020-12-29 DIAGNOSIS — C7800 Secondary malignant neoplasm of unspecified lung: Secondary | ICD-10-CM | POA: Diagnosis not present

## 2020-12-29 DIAGNOSIS — Z510 Encounter for antineoplastic radiation therapy: Secondary | ICD-10-CM | POA: Diagnosis not present

## 2020-12-29 DIAGNOSIS — C7951 Secondary malignant neoplasm of bone: Secondary | ICD-10-CM | POA: Diagnosis not present

## 2020-12-29 DIAGNOSIS — M4854XA Collapsed vertebra, not elsewhere classified, thoracic region, initial encounter for fracture: Secondary | ICD-10-CM | POA: Diagnosis not present

## 2020-12-29 DIAGNOSIS — G893 Neoplasm related pain (acute) (chronic): Secondary | ICD-10-CM | POA: Diagnosis not present

## 2020-12-29 DIAGNOSIS — M546 Pain in thoracic spine: Secondary | ICD-10-CM | POA: Diagnosis not present

## 2020-12-30 DIAGNOSIS — Z510 Encounter for antineoplastic radiation therapy: Secondary | ICD-10-CM | POA: Diagnosis not present

## 2020-12-30 DIAGNOSIS — R339 Retention of urine, unspecified: Secondary | ICD-10-CM | POA: Diagnosis not present

## 2020-12-30 DIAGNOSIS — C7800 Secondary malignant neoplasm of unspecified lung: Secondary | ICD-10-CM | POA: Diagnosis not present

## 2020-12-30 DIAGNOSIS — I10 Essential (primary) hypertension: Secondary | ICD-10-CM | POA: Diagnosis not present

## 2020-12-30 DIAGNOSIS — M546 Pain in thoracic spine: Secondary | ICD-10-CM | POA: Diagnosis not present

## 2020-12-30 DIAGNOSIS — C7951 Secondary malignant neoplasm of bone: Secondary | ICD-10-CM | POA: Diagnosis not present

## 2020-12-30 DIAGNOSIS — N1831 Chronic kidney disease, stage 3a: Secondary | ICD-10-CM | POA: Diagnosis not present

## 2020-12-30 DIAGNOSIS — M4854XA Collapsed vertebra, not elsewhere classified, thoracic region, initial encounter for fracture: Secondary | ICD-10-CM | POA: Diagnosis not present

## 2020-12-30 DIAGNOSIS — Z6837 Body mass index (BMI) 37.0-37.9, adult: Secondary | ICD-10-CM | POA: Diagnosis not present

## 2020-12-30 DIAGNOSIS — G893 Neoplasm related pain (acute) (chronic): Secondary | ICD-10-CM | POA: Diagnosis not present

## 2020-12-30 DIAGNOSIS — C61 Malignant neoplasm of prostate: Secondary | ICD-10-CM | POA: Diagnosis not present

## 2020-12-30 DIAGNOSIS — M5418 Radiculopathy, sacral and sacrococcygeal region: Secondary | ICD-10-CM | POA: Diagnosis not present

## 2020-12-30 DIAGNOSIS — I2699 Other pulmonary embolism without acute cor pulmonale: Secondary | ICD-10-CM | POA: Diagnosis not present

## 2020-12-31 DIAGNOSIS — C61 Malignant neoplasm of prostate: Secondary | ICD-10-CM | POA: Diagnosis not present

## 2020-12-31 DIAGNOSIS — G893 Neoplasm related pain (acute) (chronic): Secondary | ICD-10-CM | POA: Diagnosis not present

## 2020-12-31 DIAGNOSIS — C7951 Secondary malignant neoplasm of bone: Secondary | ICD-10-CM | POA: Diagnosis not present

## 2020-12-31 DIAGNOSIS — C7800 Secondary malignant neoplasm of unspecified lung: Secondary | ICD-10-CM | POA: Diagnosis not present

## 2020-12-31 DIAGNOSIS — M4854XA Collapsed vertebra, not elsewhere classified, thoracic region, initial encounter for fracture: Secondary | ICD-10-CM | POA: Diagnosis not present

## 2020-12-31 DIAGNOSIS — Z510 Encounter for antineoplastic radiation therapy: Secondary | ICD-10-CM | POA: Diagnosis not present

## 2020-12-31 DIAGNOSIS — R339 Retention of urine, unspecified: Secondary | ICD-10-CM | POA: Diagnosis not present

## 2020-12-31 DIAGNOSIS — M546 Pain in thoracic spine: Secondary | ICD-10-CM | POA: Diagnosis not present

## 2020-12-31 DIAGNOSIS — M5418 Radiculopathy, sacral and sacrococcygeal region: Secondary | ICD-10-CM | POA: Diagnosis not present

## 2021-01-01 DIAGNOSIS — R339 Retention of urine, unspecified: Secondary | ICD-10-CM | POA: Diagnosis not present

## 2021-01-01 DIAGNOSIS — M4854XA Collapsed vertebra, not elsewhere classified, thoracic region, initial encounter for fracture: Secondary | ICD-10-CM | POA: Diagnosis not present

## 2021-01-01 DIAGNOSIS — M546 Pain in thoracic spine: Secondary | ICD-10-CM | POA: Diagnosis not present

## 2021-01-01 DIAGNOSIS — M5418 Radiculopathy, sacral and sacrococcygeal region: Secondary | ICD-10-CM | POA: Diagnosis not present

## 2021-01-01 DIAGNOSIS — C61 Malignant neoplasm of prostate: Secondary | ICD-10-CM | POA: Diagnosis not present

## 2021-01-01 DIAGNOSIS — C7951 Secondary malignant neoplasm of bone: Secondary | ICD-10-CM | POA: Diagnosis not present

## 2021-01-01 DIAGNOSIS — Z510 Encounter for antineoplastic radiation therapy: Secondary | ICD-10-CM | POA: Diagnosis not present

## 2021-01-01 DIAGNOSIS — G893 Neoplasm related pain (acute) (chronic): Secondary | ICD-10-CM | POA: Diagnosis not present

## 2021-01-01 DIAGNOSIS — C7800 Secondary malignant neoplasm of unspecified lung: Secondary | ICD-10-CM | POA: Diagnosis not present

## 2021-01-05 ENCOUNTER — Telehealth: Payer: Self-pay

## 2021-01-05 DIAGNOSIS — C7951 Secondary malignant neoplasm of bone: Secondary | ICD-10-CM | POA: Diagnosis not present

## 2021-01-05 DIAGNOSIS — Z510 Encounter for antineoplastic radiation therapy: Secondary | ICD-10-CM | POA: Diagnosis not present

## 2021-01-05 DIAGNOSIS — C61 Malignant neoplasm of prostate: Secondary | ICD-10-CM | POA: Diagnosis not present

## 2021-01-05 NOTE — Telephone Encounter (Signed)
Eddie Dibbles from [facility] called stating patient has not started his medication for chemo yet and provided two options for patient's f/u appointment with Dr. Alyson Ingles. Options reviewed with Dr. Alyson Ingles and patient will receive another Firmagon 80 at f/u and will received Eligard in February.

## 2021-01-06 ENCOUNTER — Other Ambulatory Visit: Payer: Medicare HMO

## 2021-01-06 ENCOUNTER — Other Ambulatory Visit: Payer: Self-pay

## 2021-01-06 DIAGNOSIS — C7951 Secondary malignant neoplasm of bone: Secondary | ICD-10-CM | POA: Diagnosis not present

## 2021-01-06 DIAGNOSIS — C61 Malignant neoplasm of prostate: Secondary | ICD-10-CM | POA: Diagnosis not present

## 2021-01-06 DIAGNOSIS — Z510 Encounter for antineoplastic radiation therapy: Secondary | ICD-10-CM | POA: Diagnosis not present

## 2021-01-07 DIAGNOSIS — C7951 Secondary malignant neoplasm of bone: Secondary | ICD-10-CM | POA: Diagnosis not present

## 2021-01-07 DIAGNOSIS — C61 Malignant neoplasm of prostate: Secondary | ICD-10-CM | POA: Diagnosis not present

## 2021-01-07 DIAGNOSIS — Z510 Encounter for antineoplastic radiation therapy: Secondary | ICD-10-CM | POA: Diagnosis not present

## 2021-01-07 LAB — PSA: Prostate Specific Ag, Serum: 506 ng/mL — ABNORMAL HIGH (ref 0.0–4.0)

## 2021-01-13 ENCOUNTER — Encounter: Payer: Self-pay | Admitting: Urology

## 2021-01-13 ENCOUNTER — Other Ambulatory Visit: Payer: Self-pay

## 2021-01-13 ENCOUNTER — Ambulatory Visit: Payer: Medicare HMO | Admitting: Urology

## 2021-01-13 VITALS — BP 159/81 | HR 101 | Wt 242.0 lb

## 2021-01-13 DIAGNOSIS — N138 Other obstructive and reflux uropathy: Secondary | ICD-10-CM | POA: Diagnosis not present

## 2021-01-13 DIAGNOSIS — C61 Malignant neoplasm of prostate: Secondary | ICD-10-CM

## 2021-01-13 DIAGNOSIS — N401 Enlarged prostate with lower urinary tract symptoms: Secondary | ICD-10-CM | POA: Diagnosis not present

## 2021-01-13 DIAGNOSIS — R339 Retention of urine, unspecified: Secondary | ICD-10-CM

## 2021-01-13 DIAGNOSIS — R972 Elevated prostate specific antigen [PSA]: Secondary | ICD-10-CM | POA: Diagnosis not present

## 2021-01-13 MED ORDER — TAMSULOSIN HCL 0.4 MG PO CAPS: 0.8000 mg | ORAL_CAPSULE | Freq: Every day | ORAL | 11 refills | Status: DC

## 2021-01-13 MED ORDER — DEGARELIX ACETATE 80 MG ~~LOC~~ SOLR
80.0000 mg | Freq: Once | SUBCUTANEOUS | Status: AC
  Administered 2021-01-13: 80 mg via SUBCUTANEOUS

## 2021-01-13 NOTE — Progress Notes (Signed)
Firmagon Sub Q Injection  Due to Prostate Cancer patient is present today for a Firmagon Injection.   Medication: Firmagon (Degarelix)  Dose: 80mg  Location: right upper abdomen   Patient tolerated well, no complications were noted  Performed by: Kennidy Lamke LPN  Follow up: Per MD note

## 2021-01-13 NOTE — Progress Notes (Signed)
01/13/2021 9:18 AM   Johnny Fischer January 07, 1945 [id number]  Referring provider: Neale Burly, MD [address] [place],  [place] [address]  Followup prostate cancer   HPI: Johnny Fischer is a 76yo here for followup for prostate cancer. PSA decreased to 500 on firmagon. He had a port placed and is scheduled to start chemotherapy in the next week. He denies any hot flashes. No worsening LUTS. No other complaints today   PMH: Past Medical History:  Diagnosis Date   Arthritis    DVT (deep vein thrombosis) 12/2019   Hypertension     Surgical History: Past Surgical History:  Procedure Laterality Date   CYSTOSCOPY N/A 05/04/2020   Procedure: CYSTOSCOPY;  Surgeon: Cleon Gustin, MD;  Location: [facility];  Service: Urology;  Laterality: N/A;   CYSTOSCOPY WITH INSERTION OF UROLIFT N/A 05/04/2020   Procedure: CYSTOSCOPY WITH INSERTION OF UROLIFT;  Surgeon: Cleon Gustin, MD;  Location: [facility];  Service: Urology;  Laterality: N/A;   CYSTOSCOPY WITH INSERTION OF UROLIFT N/A 10/05/2020   Procedure: CYSTOSCOPY WITH INSERTION OF UROLIFT;  Surgeon: Cleon Gustin, MD;  Location: [facility];  Service: Urology;  Laterality: N/A;   JOINT REPLACEMENT     TOTAL KNEE ARTHROPLASTY Right     Home Medications:  Allergies as of 01/13/2021   No Known Allergies      Medication List        Accurate as of January 13, 2021  9:18 AM. If you have any questions, ask your nurse or doctor.          amLODipine 10 MG tablet Commonly known as: NORVASC Take 10 mg by mouth daily.   benazepril 10 MG tablet Commonly known as: LOTENSIN Take 10 mg by mouth daily.   carvedilol 12.5 MG tablet Commonly known as: COREG Take 12.5 mg by mouth 2 (two) times daily.   cephALEXin 500 MG capsule Commonly known as: KEFLEX Take 1 capsule (500 mg total) by mouth 3 (three) times daily.   chlorthalidone 25 MG tablet Commonly known as: HYGROTON Take 25 mg by mouth daily.   Eliquis 5 MG Tabs  tablet Generic drug: apixaban Take 5 mg by mouth 2 (two) times daily.   furosemide 40 MG tablet Commonly known as: LASIX Take 40 mg by mouth daily.   losartan 100 MG tablet Commonly known as: COZAAR Take 100 mg by mouth daily.   olmesartan 40 MG tablet Commonly known as: BENICAR Take 40 mg by mouth daily.   spironolactone 25 MG tablet Commonly known as: ALDACTONE Take 25 mg by mouth 2 (two) times daily.   sulfamethoxazole-trimethoprim 800-160 MG tablet Commonly known as: BACTRIM DS Take 1 tablet by mouth every 12 (twelve) hours.   tamsulosin 0.4 MG Caps capsule Commonly known as: FLOMAX Take 1 capsule (0.4 mg total) by mouth daily. What changed: how much to take   traMADol 50 MG tablet Commonly known as: Ultram Take 1 tablet (50 mg total) by mouth every 6 (six) hours as needed.        Allergies: No Known Allergies  Family History: Family History  Family history unknown: Yes    Social History:  reports that he has never smoked. He has never used smokeless tobacco. He reports that he does not currently use alcohol. He reports that he does not use drugs.  ROS: All other review of systems were reviewed and are negative except what is noted above in HPI  Physical Exam: BP (!) 159/81  Pulse (!) 101    Wt 242 lb (109.8 kg)    BMI 35.74 kg/m   Constitutional:  Alert and oriented, No acute distress. HEENT: [place] AT, moist mucus membranes.  Trachea midline, no masses. Cardiovascular: No clubbing, cyanosis, or edema. Respiratory: Normal respiratory effort, no increased work of breathing. GI: Abdomen is soft, nontender, nondistended, no abdominal masses GU: No CVA tenderness.  Lymph: No cervical or inguinal lymphadenopathy. Skin: No rashes, bruises or suspicious lesions. Neurologic: Grossly intact, no focal deficits, moving all 4 extremities. Psychiatric: Normal mood and affect.  Laboratory Data: Lab Results  Component Value Date   WBC 7.4 08/26/2020   HGB 11.2  (L) 08/26/2020   HCT 35.8 (L) 08/26/2020   MCV 92.0 08/26/2020   PLT 287 08/26/2020    Lab Results  Component Value Date   CREATININE 1.17 08/26/2020    Lab Results  Component Value Date   PSA 2.22 10/18/2007   PSA 1.51 05/08/2006   PSA NORMAL 03/16/2005    No results found for: TESTOSTERONE  No results found for: HGBA1C  Urinalysis    Component Value Date/Time   COLORURINE RED (A) 10/05/2020 2219   APPEARANCEUR Cloudy (A) 10/23/2020 0917   LABSPEC 1.013 10/05/2020 2219   PHURINE 6.0 10/05/2020 2219   GLUCOSEU Negative 10/23/2020 0917   HGBUR MODERATE (A) 10/05/2020 2219   HGBUR negative 07/19/2007 0918   BILIRUBINUR Negative 10/23/2020 0917   KETONESUR NEGATIVE 10/05/2020 2219   PROTEINUR 2+ (A) 10/23/2020 0917   PROTEINUR 100 (A) 10/05/2020 2219   UROBILINOGEN 0.2 12/13/2019 1115   UROBILINOGEN 1.0 07/19/2007 0918   NITRITE Positive (A) 10/23/2020 0917   NITRITE NEGATIVE 10/05/2020 2219   LEUKOCYTESUR 2+ (A) 10/23/2020 0917   LEUKOCYTESUR TRACE (A) 10/05/2020 2219    Lab Results  Component Value Date   LABMICR See below: 10/23/2020   WBCUA >30 (A) 10/23/2020   LABEPIT 0-10 10/23/2020   BACTERIA Many (A) 10/23/2020    Pertinent Imaging:  No results found for this or any previous visit.  No results found for this or any previous visit.  No results found for this or any previous visit.  No results found for this or any previous visit.  No results found for this or any previous visit.  No results found for this or any previous visit.  No results found for this or any previous visit.  No results found for this or any previous visit.   Assessment & Plan:    1. Urinary retention -continue foley catheter  2.  Benign prostatic hyperplasia with urinary obstruction -continue foley catheter  4. Prostate cancer ([place]) -RTC 1 month with PSA and testosterone. - degarelix (FIRMAGON) injection 80 mg today -Eligard 45mg  in 1 month   No follow-ups on  file.  Nicolette Bang, MD  [facility] [place]

## 2021-01-13 NOTE — Patient Instructions (Signed)
Prostate Cancer °The prostate is a small gland that helps make semen. It is located below a man's bladder, in front of the rectum. Prostate cancer is when abnormal cells grow in this gland. °What are the causes? °The cause of this condition is not known. °What increases the risk? °Being age 74 or older. °Having a family history of prostate cancer. °Having a family history of cancer of the breasts or ovaries. °Having genes that are passed from parent to child (inherited). °Having Lynch syndrome. °African American men and men of African descent are diagnosed with prostate cancer at higher rates than other men. °What are the signs or symptoms? °Problems peeing (urinating). This may include: °A stream that is weak, or pee that stops and starts. °Trouble starting or stopping your pee. °Trouble emptying all of your pee. °Needing to pee more often, especially at night. °Blood in your pee or semen. °Pain in the: °Lower back. °Lower belly (abdomen). °Hips. °Trouble getting an erection. °Weakness or numbness in the legs or feet. °How is this treated? °Treatment for this condition depends on: °How much the cancer has spread. °Your age. °The kind of treatment you want. °Your health. °Treatments include: °Being watched. This is called observation. You will be tested from time to time, but you will not get treated. Tests are to make sure that the cancer is not growing. °Surgery. This may be done to: °Take out (remove) the prostate. °Freeze and kill cancer cells. °Radiation. This uses a strong beam of energy to kill cancer cells. °Chemotherapy. This uses medicines that stop cancer cells from increasing. This kills cancer cells and healthy cells. °Targeted therapy. This kills cancer cells only. Healthy cells are not affected. °Hormone treatment. This stops the body from making hormones that help the cancer cells grow. °Follow these instructions at home: °Lifestyle °Do not smoke or use any products that contain nicotine or tobacco.  If you need help quitting, ask your doctor. °Eat a healthy diet. °Treatment may affect your ability to have sex. If you have a partner, touch, hold, hug, and caress your partner to have intimate moments. °Get plenty of sleep. °Ask your doctor for help to find a support group for men with prostate cancer. °General instructions °Take over-the-counter and prescription medicines only as told by your doctor. °If you have to go to the hospital, let your cancer doctor (oncologist) know. °Keep all follow-up visits. °Where to find more information °American Cancer Society: www.cancer.org °American Society of Clinical Oncology: www.cancer.net °National Cancer Institute: www.cancer.gov °Contact a doctor if: °You have new or more trouble peeing. °You have new or more blood in your pee. °You have new or more pain in your hips, back, or chest. °Get help right away if: °You have weakness in your legs. °You lose feeling in your legs. °You cannot control your pee or your poop (stool). °You have chills or a fever. °Summary °The prostate is a male gland that helps make semen. °Prostate cancer is when abnormal cells grow in this gland. °Treatment includes doing surgery, using medicines, using strong beams of energy, or watching without treatment. °Ask your doctor for help to find a support group for men with prostate cancer. °Contact a doctor if you have problems peeing or have any new pain that you did not have before. °This information is not intended to replace advice given to you by your health care provider. Make sure you discuss any questions you have with your health care provider. °Document Revised: 03/18/2020 Document Reviewed: 03/18/2020 °Elsevier   Patient Education © 2022 Elsevier Inc. ° °

## 2021-01-13 NOTE — Progress Notes (Signed)

## 2021-01-14 DIAGNOSIS — Z6834 Body mass index (BMI) 34.0-34.9, adult: Secondary | ICD-10-CM | POA: Diagnosis not present

## 2021-01-14 DIAGNOSIS — C7651 Malignant neoplasm of right lower limb: Secondary | ICD-10-CM | POA: Diagnosis not present

## 2021-01-14 DIAGNOSIS — Z923 Personal history of irradiation: Secondary | ICD-10-CM | POA: Diagnosis not present

## 2021-01-14 DIAGNOSIS — E876 Hypokalemia: Secondary | ICD-10-CM | POA: Diagnosis not present

## 2021-01-14 DIAGNOSIS — M533 Sacrococcygeal disorders, not elsewhere classified: Secondary | ICD-10-CM | POA: Diagnosis not present

## 2021-01-14 DIAGNOSIS — N182 Chronic kidney disease, stage 2 (mild): Secondary | ICD-10-CM | POA: Diagnosis not present

## 2021-01-14 DIAGNOSIS — Z8042 Family history of malignant neoplasm of prostate: Secondary | ICD-10-CM | POA: Diagnosis not present

## 2021-01-14 DIAGNOSIS — C7652 Malignant neoplasm of left lower limb: Secondary | ICD-10-CM | POA: Diagnosis not present

## 2021-01-14 DIAGNOSIS — C61 Malignant neoplasm of prostate: Secondary | ICD-10-CM | POA: Diagnosis not present

## 2021-01-14 DIAGNOSIS — D649 Anemia, unspecified: Secondary | ICD-10-CM | POA: Diagnosis not present

## 2021-01-14 DIAGNOSIS — G893 Neoplasm related pain (acute) (chronic): Secondary | ICD-10-CM | POA: Diagnosis not present

## 2021-01-14 DIAGNOSIS — E669 Obesity, unspecified: Secondary | ICD-10-CM | POA: Diagnosis not present

## 2021-01-20 DIAGNOSIS — Z5111 Encounter for antineoplastic chemotherapy: Secondary | ICD-10-CM | POA: Diagnosis not present

## 2021-01-20 DIAGNOSIS — C799 Secondary malignant neoplasm of unspecified site: Secondary | ICD-10-CM | POA: Diagnosis not present

## 2021-01-20 DIAGNOSIS — C61 Malignant neoplasm of prostate: Secondary | ICD-10-CM | POA: Diagnosis not present

## 2021-01-20 DIAGNOSIS — E876 Hypokalemia: Secondary | ICD-10-CM | POA: Diagnosis not present

## 2021-01-21 DIAGNOSIS — Z7689 Persons encountering health services in other specified circumstances: Secondary | ICD-10-CM | POA: Diagnosis not present

## 2021-01-21 DIAGNOSIS — C61 Malignant neoplasm of prostate: Secondary | ICD-10-CM | POA: Diagnosis not present

## 2021-01-25 ENCOUNTER — Telehealth: Payer: Self-pay

## 2021-01-25 NOTE — Telephone Encounter (Signed)
Anderson Malta for [facility] called to confirm [place] appointment. Appt given to [facility].

## 2021-02-05 ENCOUNTER — Other Ambulatory Visit: Payer: Medicare HMO

## 2021-02-05 ENCOUNTER — Other Ambulatory Visit: Payer: Self-pay

## 2021-02-05 DIAGNOSIS — C61 Malignant neoplasm of prostate: Secondary | ICD-10-CM | POA: Diagnosis not present

## 2021-02-06 LAB — PSA: Prostate Specific Ag, Serum: 207 ng/mL — ABNORMAL HIGH (ref 0.0–4.0)

## 2021-02-06 LAB — TESTOSTERONE: Testosterone: <3 ng/dL — ABNORMAL LOW (ref 264–916)

## 2021-02-09 DIAGNOSIS — C7951 Secondary malignant neoplasm of bone: Secondary | ICD-10-CM | POA: Diagnosis not present

## 2021-02-09 DIAGNOSIS — E669 Obesity, unspecified: Secondary | ICD-10-CM | POA: Diagnosis not present

## 2021-02-09 DIAGNOSIS — C61 Malignant neoplasm of prostate: Secondary | ICD-10-CM | POA: Diagnosis not present

## 2021-02-09 DIAGNOSIS — G893 Neoplasm related pain (acute) (chronic): Secondary | ICD-10-CM | POA: Diagnosis not present

## 2021-02-09 DIAGNOSIS — C7801 Secondary malignant neoplasm of right lung: Secondary | ICD-10-CM | POA: Diagnosis not present

## 2021-02-09 DIAGNOSIS — N182 Chronic kidney disease, stage 2 (mild): Secondary | ICD-10-CM | POA: Diagnosis not present

## 2021-02-09 DIAGNOSIS — I129 Hypertensive chronic kidney disease with stage 1 through stage 4 chronic kidney disease, or unspecified chronic kidney disease: Secondary | ICD-10-CM | POA: Diagnosis not present

## 2021-02-09 DIAGNOSIS — C7802 Secondary malignant neoplasm of left lung: Secondary | ICD-10-CM | POA: Diagnosis not present

## 2021-02-09 DIAGNOSIS — M1991 Primary osteoarthritis, unspecified site: Secondary | ICD-10-CM | POA: Diagnosis not present

## 2021-02-09 DIAGNOSIS — M8458XA Pathological fracture in neoplastic disease, other specified site, initial encounter for fracture: Secondary | ICD-10-CM | POA: Diagnosis not present

## 2021-02-09 DIAGNOSIS — Z6836 Body mass index (BMI) 36.0-36.9, adult: Secondary | ICD-10-CM | POA: Diagnosis not present

## 2021-02-10 DIAGNOSIS — C61 Malignant neoplasm of prostate: Secondary | ICD-10-CM | POA: Diagnosis not present

## 2021-02-10 DIAGNOSIS — Z5111 Encounter for antineoplastic chemotherapy: Secondary | ICD-10-CM | POA: Diagnosis not present

## 2021-02-11 DIAGNOSIS — C799 Secondary malignant neoplasm of unspecified site: Secondary | ICD-10-CM | POA: Diagnosis not present

## 2021-02-11 DIAGNOSIS — Z7689 Persons encountering health services in other specified circumstances: Secondary | ICD-10-CM | POA: Diagnosis not present

## 2021-02-11 DIAGNOSIS — C61 Malignant neoplasm of prostate: Secondary | ICD-10-CM | POA: Diagnosis not present

## 2021-02-12 ENCOUNTER — Other Ambulatory Visit: Payer: Self-pay

## 2021-02-12 ENCOUNTER — Ambulatory Visit (INDEPENDENT_AMBULATORY_CARE_PROVIDER_SITE_OTHER): Payer: Medicare HMO | Admitting: Urology

## 2021-02-12 VITALS — BP 162/67 | HR 92 | Wt 242.0 lb

## 2021-02-12 DIAGNOSIS — N401 Enlarged prostate with lower urinary tract symptoms: Secondary | ICD-10-CM | POA: Diagnosis not present

## 2021-02-12 DIAGNOSIS — N138 Other obstructive and reflux uropathy: Secondary | ICD-10-CM

## 2021-02-12 DIAGNOSIS — C61 Malignant neoplasm of prostate: Secondary | ICD-10-CM

## 2021-02-12 DIAGNOSIS — R339 Retention of urine, unspecified: Secondary | ICD-10-CM

## 2021-02-12 MED ORDER — LEUPROLIDE ACETATE (6 MONTH) 45 MG ~~LOC~~ KIT
45.0000 mg | PACK | Freq: Once | SUBCUTANEOUS | Status: AC
  Administered 2021-02-12: 45 mg via SUBCUTANEOUS

## 2021-02-12 NOTE — Progress Notes (Signed)
Lupron IM Injection   Due to Prostate Cancer patient is present today for a Lupron Injection.  Medication: Lupron 6 month Dose: 45 mg  Location: left upper outer buttocks Patient tolerated well, no complications were noted  Performed by: Arletta Lumadue LPN  Follow up: Per MD note

## 2021-02-12 NOTE — Progress Notes (Signed)
02/12/2021 12:03 PM   Johnny Fischer 12-09-1945 [id number]  Referring provider: Neale Burly, MD [address],  [place] [address]  Followup BPH and Metastatic prostate cancer   HPI: Johnny Fischer is a 76yo here for followup for  metastatic prostate cancer, BPH and urinary retention. PSA decreased to 167 on ADT. He denies any hot flashes. He has an indwelling foley due to urinary retention and has failed multiple voiding trials. He wishes to keep the foley catheter.    PMH: Past Medical History:  Diagnosis Date   Arthritis    DVT (deep vein thrombosis) 12/2019   Hypertension     Surgical History: Past Surgical History:  Procedure Laterality Date   CYSTOSCOPY N/A 05/04/2020   Procedure: CYSTOSCOPY;  Surgeon: Cleon Gustin, MD;  Location: [facility];  Service: Urology;  Laterality: N/A;   CYSTOSCOPY WITH INSERTION OF UROLIFT N/A 05/04/2020   Procedure: CYSTOSCOPY WITH INSERTION OF UROLIFT;  Surgeon: Cleon Gustin, MD;  Location: [facility];  Service: Urology;  Laterality: N/A;   CYSTOSCOPY WITH INSERTION OF UROLIFT N/A 10/05/2020   Procedure: CYSTOSCOPY WITH INSERTION OF UROLIFT;  Surgeon: Cleon Gustin, MD;  Location: [facility];  Service: Urology;  Laterality: N/A;   JOINT REPLACEMENT     TOTAL KNEE ARTHROPLASTY Right     Home Medications:  Allergies as of 02/12/2021   No Known Allergies      Medication List        Accurate as of February 12, 2021 12:03 PM. If you have any questions, ask your nurse or doctor.          amLODipine 10 MG tablet Commonly known as: NORVASC Take 10 mg by mouth daily.   benazepril 10 MG tablet Commonly known as: LOTENSIN Take 10 mg by mouth daily.   carvedilol 12.5 MG tablet Commonly known as: COREG Take 12.5 mg by mouth 2 (two) times daily.   cephALEXin 500 MG capsule Commonly known as: KEFLEX Take 1 capsule (500 mg total) by mouth 3 (three) times daily.   chlorthalidone 25 MG tablet Commonly known as:  HYGROTON Take 25 mg by mouth daily.   Eliquis 5 MG Tabs tablet Generic drug: apixaban Take 5 mg by mouth 2 (two) times daily.   furosemide 40 MG tablet Commonly known as: LASIX Take 40 mg by mouth daily.   losartan 100 MG tablet Commonly known as: COZAAR Take 100 mg by mouth daily.   olmesartan 40 MG tablet Commonly known as: BENICAR Take 40 mg by mouth daily.   spironolactone 25 MG tablet Commonly known as: ALDACTONE Take 25 mg by mouth 2 (two) times daily.   sulfamethoxazole-trimethoprim 800-160 MG tablet Commonly known as: BACTRIM DS Take 1 tablet by mouth every 12 (twelve) hours.   tamsulosin 0.4 MG Caps capsule Commonly known as: FLOMAX Take 2 capsules (0.8 mg total) by mouth daily.   traMADol 50 MG tablet Commonly known as: Ultram Take 1 tablet (50 mg total) by mouth every 6 (six) hours as needed.        Allergies: No Known Allergies  Family History: Family History  Family history unknown: Yes    Social History:  reports that he has never smoked. He has never used smokeless tobacco. He reports that he does not currently use alcohol. He reports that he does not use drugs.  ROS: All other review of systems were reviewed and are negative except what is noted above in HPI  Physical Exam: BP Marland Kitchen)  162/67    Pulse 92    Wt 242 lb (109.8 kg)    BMI 35.74 kg/m   Constitutional:  Alert and oriented, No acute distress. HEENT: Johnny Fischer AT, moist mucus membranes.  Trachea midline, no masses. Cardiovascular: No clubbing, cyanosis, or edema. Respiratory: Normal respiratory effort, no increased work of breathing. GI: Abdomen is soft, nontender, nondistended, no abdominal masses GU: No CVA tenderness.  Lymph: No cervical or inguinal lymphadenopathy. Skin: No rashes, bruises or suspicious lesions. Neurologic: Grossly intact, no focal deficits, moving all 4 extremities. Psychiatric: Normal mood and affect.  Laboratory Data: Lab Results  Component Value Date   WBC 7.4  08/26/2020   HGB 11.2 (L) 08/26/2020   HCT 35.8 (L) 08/26/2020   MCV 92.0 08/26/2020   PLT 287 08/26/2020    Lab Results  Component Value Date   CREATININE 1.17 08/26/2020    Lab Results  Component Value Date   PSA 2.22 10/18/2007   PSA 1.51 05/08/2006   PSA NORMAL 03/16/2005    Lab Results  Component Value Date   TESTOSTERONE <3 (L) 02/05/2021    No results found for: HGBA1C  Urinalysis    Component Value Date/Time   COLORURINE RED (A) 10/05/2020 2219   APPEARANCEUR Cloudy (A) 10/23/2020 0917   LABSPEC 1.013 10/05/2020 2219   PHURINE 6.0 10/05/2020 2219   GLUCOSEU Negative 10/23/2020 0917   HGBUR MODERATE (A) 10/05/2020 2219   HGBUR negative 07/19/2007 0918   BILIRUBINUR Negative 10/23/2020 0917   KETONESUR NEGATIVE 10/05/2020 2219   PROTEINUR 2+ (A) 10/23/2020 0917   PROTEINUR 100 (A) 10/05/2020 2219   UROBILINOGEN 0.2 12/13/2019 1115   UROBILINOGEN 1.0 07/19/2007 0918   NITRITE Positive (A) 10/23/2020 0917   NITRITE NEGATIVE 10/05/2020 2219   LEUKOCYTESUR 2+ (A) 10/23/2020 0917   LEUKOCYTESUR TRACE (A) 10/05/2020 2219    Lab Results  Component Value Date   LABMICR See below: 10/23/2020   WBCUA >30 (A) 10/23/2020   LABEPIT 0-10 10/23/2020   BACTERIA Many (A) 10/23/2020    Pertinent Imaging:  No results found for this or any previous visit.  No results found for this or any previous visit.  No results found for this or any previous visit.  No results found for this or any previous visit.  No results found for this or any previous visit.  No results found for this or any previous visit.  No results found for this or any previous visit.  No results found for this or any previous visit.   Assessment & Plan:    1. Prostate cancer ([place]) -Eligard 45mg  today. RTC 6 months with PSA  2. Benign prostatic hyperplasia with urinary obstruction -RTC 1 month for foley change  3. Urinary retention -Continue indwelling foley catheter. RTC 1 month  for foley change   No follow-ups on file.  Nicolette Bang, MD  [facility] [place]

## 2021-02-16 DIAGNOSIS — C7951 Secondary malignant neoplasm of bone: Secondary | ICD-10-CM | POA: Diagnosis not present

## 2021-02-16 DIAGNOSIS — Z510 Encounter for antineoplastic radiation therapy: Secondary | ICD-10-CM | POA: Diagnosis not present

## 2021-02-16 DIAGNOSIS — C61 Malignant neoplasm of prostate: Secondary | ICD-10-CM | POA: Diagnosis not present

## 2021-02-18 ENCOUNTER — Encounter: Payer: Self-pay | Admitting: Urology

## 2021-02-18 NOTE — Patient Instructions (Signed)
Prostate Cancer °The prostate is a small gland that helps make semen. It is located below a man's bladder, in front of the rectum. Prostate cancer is when abnormal cells grow in this gland. °What are the causes? °The cause of this condition is not known. °What increases the risk? °Being age 74 or older. °Having a family history of prostate cancer. °Having a family history of cancer of the breasts or ovaries. °Having genes that are passed from parent to child (inherited). °Having Lynch syndrome. °African American men and men of African descent are diagnosed with prostate cancer at higher rates than other men. °What are the signs or symptoms? °Problems peeing (urinating). This may include: °A stream that is weak, or pee that stops and starts. °Trouble starting or stopping your pee. °Trouble emptying all of your pee. °Needing to pee more often, especially at night. °Blood in your pee or semen. °Pain in the: °Lower back. °Lower belly (abdomen). °Hips. °Trouble getting an erection. °Weakness or numbness in the legs or feet. °How is this treated? °Treatment for this condition depends on: °How much the cancer has spread. °Your age. °The kind of treatment you want. °Your health. °Treatments include: °Being watched. This is called observation. You will be tested from time to time, but you will not get treated. Tests are to make sure that the cancer is not growing. °Surgery. This may be done to: °Take out (remove) the prostate. °Freeze and kill cancer cells. °Radiation. This uses a strong beam of energy to kill cancer cells. °Chemotherapy. This uses medicines that stop cancer cells from increasing. This kills cancer cells and healthy cells. °Targeted therapy. This kills cancer cells only. Healthy cells are not affected. °Hormone treatment. This stops the body from making hormones that help the cancer cells grow. °Follow these instructions at home: °Lifestyle °Do not smoke or use any products that contain nicotine or tobacco.  If you need help quitting, ask your doctor. °Eat a healthy diet. °Treatment may affect your ability to have sex. If you have a partner, touch, hold, hug, and caress your partner to have intimate moments. °Get plenty of sleep. °Ask your doctor for help to find a support group for men with prostate cancer. °General instructions °Take over-the-counter and prescription medicines only as told by your doctor. °If you have to go to the hospital, let your cancer doctor (oncologist) know. °Keep all follow-up visits. °Where to find more information °American Cancer Society: www.cancer.org °American Society of Clinical Oncology: www.cancer.net °National Cancer Institute: www.cancer.gov °Contact a doctor if: °You have new or more trouble peeing. °You have new or more blood in your pee. °You have new or more pain in your hips, back, or chest. °Get help right away if: °You have weakness in your legs. °You lose feeling in your legs. °You cannot control your pee or your poop (stool). °You have chills or a fever. °Summary °The prostate is a male gland that helps make semen. °Prostate cancer is when abnormal cells grow in this gland. °Treatment includes doing surgery, using medicines, using strong beams of energy, or watching without treatment. °Ask your doctor for help to find a support group for men with prostate cancer. °Contact a doctor if you have problems peeing or have any new pain that you did not have before. °This information is not intended to replace advice given to you by your health care provider. Make sure you discuss any questions you have with your health care provider. °Document Revised: 03/18/2020 Document Reviewed: 03/18/2020 °Elsevier   Patient Education © 2022 Elsevier Inc. ° °

## 2021-02-22 DIAGNOSIS — R262 Difficulty in walking, not elsewhere classified: Secondary | ICD-10-CM | POA: Diagnosis not present

## 2021-02-22 DIAGNOSIS — C7951 Secondary malignant neoplasm of bone: Secondary | ICD-10-CM | POA: Diagnosis not present

## 2021-02-22 DIAGNOSIS — R29898 Other symptoms and signs involving the musculoskeletal system: Secondary | ICD-10-CM | POA: Diagnosis not present

## 2021-02-23 DIAGNOSIS — M47816 Spondylosis without myelopathy or radiculopathy, lumbar region: Secondary | ICD-10-CM | POA: Diagnosis not present

## 2021-02-23 DIAGNOSIS — M549 Dorsalgia, unspecified: Secondary | ICD-10-CM | POA: Diagnosis not present

## 2021-02-23 DIAGNOSIS — C61 Malignant neoplasm of prostate: Secondary | ICD-10-CM | POA: Diagnosis not present

## 2021-02-23 DIAGNOSIS — R531 Weakness: Secondary | ICD-10-CM | POA: Diagnosis not present

## 2021-02-23 DIAGNOSIS — C7951 Secondary malignant neoplasm of bone: Secondary | ICD-10-CM | POA: Diagnosis not present

## 2021-02-23 DIAGNOSIS — R269 Unspecified abnormalities of gait and mobility: Secondary | ICD-10-CM | POA: Diagnosis not present

## 2021-02-23 DIAGNOSIS — M5126 Other intervertebral disc displacement, lumbar region: Secondary | ICD-10-CM | POA: Diagnosis not present

## 2021-02-23 DIAGNOSIS — Z9181 History of falling: Secondary | ICD-10-CM | POA: Diagnosis not present

## 2021-02-26 DIAGNOSIS — C7951 Secondary malignant neoplasm of bone: Secondary | ICD-10-CM | POA: Diagnosis not present

## 2021-02-26 DIAGNOSIS — R262 Difficulty in walking, not elsewhere classified: Secondary | ICD-10-CM | POA: Diagnosis not present

## 2021-02-26 DIAGNOSIS — R29898 Other symptoms and signs involving the musculoskeletal system: Secondary | ICD-10-CM | POA: Diagnosis not present

## 2021-03-01 DIAGNOSIS — E876 Hypokalemia: Secondary | ICD-10-CM | POA: Diagnosis not present

## 2021-03-01 DIAGNOSIS — D649 Anemia, unspecified: Secondary | ICD-10-CM | POA: Diagnosis not present

## 2021-03-01 DIAGNOSIS — C61 Malignant neoplasm of prostate: Secondary | ICD-10-CM | POA: Diagnosis not present

## 2021-03-03 DIAGNOSIS — Z5111 Encounter for antineoplastic chemotherapy: Secondary | ICD-10-CM | POA: Diagnosis not present

## 2021-03-03 DIAGNOSIS — C61 Malignant neoplasm of prostate: Secondary | ICD-10-CM | POA: Diagnosis not present

## 2021-03-04 DIAGNOSIS — C61 Malignant neoplasm of prostate: Secondary | ICD-10-CM | POA: Diagnosis not present

## 2021-03-04 DIAGNOSIS — Z7689 Persons encountering health services in other specified circumstances: Secondary | ICD-10-CM | POA: Diagnosis not present

## 2021-03-16 ENCOUNTER — Ambulatory Visit: Payer: Medicare HMO

## 2021-03-19 ENCOUNTER — Other Ambulatory Visit: Payer: Self-pay

## 2021-03-19 ENCOUNTER — Ambulatory Visit (INDEPENDENT_AMBULATORY_CARE_PROVIDER_SITE_OTHER): Payer: Medicare HMO

## 2021-03-19 DIAGNOSIS — N401 Enlarged prostate with lower urinary tract symptoms: Secondary | ICD-10-CM | POA: Diagnosis not present

## 2021-03-19 DIAGNOSIS — R339 Retention of urine, unspecified: Secondary | ICD-10-CM | POA: Diagnosis not present

## 2021-03-19 DIAGNOSIS — N138 Other obstructive and reflux uropathy: Secondary | ICD-10-CM | POA: Diagnosis not present

## 2021-03-19 NOTE — Progress Notes (Signed)
Cath Change/ Replacement ? ?Patient is present today for a catheter change due to urinary retention.  26m of water was removed from the balloon, a 16FR foley cath was removed with out difficulty.  Patient was cleaned and prepped in a sterile fashion with betadine. A 16 FR foley cath was replaced into the bladder no complications were noted Urine return was noted 176mand urine was yellow in color. The balloon was filled with 1069mf sterile water. A leg bag was attached for drainage.  A night bag was also given to the patient and patient was given instruction on how to change from one bag to another. Patient was given proper instruction on catheter care.   ? ?Performed by: KouLevi AlandMA ? ?Follow up: Keep next scheduled appointment.  ?

## 2021-03-23 DIAGNOSIS — E876 Hypokalemia: Secondary | ICD-10-CM | POA: Diagnosis not present

## 2021-03-23 DIAGNOSIS — C61 Malignant neoplasm of prostate: Secondary | ICD-10-CM | POA: Diagnosis not present

## 2021-03-23 DIAGNOSIS — M7989 Other specified soft tissue disorders: Secondary | ICD-10-CM | POA: Diagnosis not present

## 2021-03-23 DIAGNOSIS — G893 Neoplasm related pain (acute) (chronic): Secondary | ICD-10-CM | POA: Diagnosis not present

## 2021-03-23 DIAGNOSIS — R0602 Shortness of breath: Secondary | ICD-10-CM | POA: Diagnosis not present

## 2021-03-23 DIAGNOSIS — Z86718 Personal history of other venous thrombosis and embolism: Secondary | ICD-10-CM | POA: Diagnosis not present

## 2021-03-23 DIAGNOSIS — I700 Atherosclerosis of aorta: Secondary | ICD-10-CM | POA: Diagnosis not present

## 2021-03-25 ENCOUNTER — Encounter: Payer: Self-pay | Admitting: Gastroenterology

## 2021-03-25 NOTE — Progress Notes (Signed)
? ?Referring Provider: Neale Burly, MD ?Primary Care Physician:  Neale Burly, MD ?Primary Gastroenterologist:  Dr. Abbey Chatters ? ?No chief complaint on file. ? ? ?HPI:   ?Johnny Fischer is a 76 y.o. male presenting today at the request of Dr. Sherrie Sport for consult colonoscopy.  ? ?No prior colon cancer.  No family history of colon cancer or colon polyps.  No GI concerns.  Denies constipation, diarrhea, abdominal pain, BRBPR, melena, unintentional weight loss, nausea, vomiting, reflux symptoms, or dysphagia. ? ?Denies CP. Admits to SOB with walking. Chronic since COVID over a year ago.   ? ?Reports having a blood clot in his lung when he had COVID over a year ago. On Eliquis.  ? ?Has a foley catheter.  ? 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  ?At his most recent oncology visit 03/23/2021, she was felt to have toxicity associated with Taxotere with interstitial fluid leak in the setting of massive edema in the legs and thorax.  Taxotere was stopped.  Chest x-ray obtained. Patient reports this was completed on 3/21, but doesn't know the result.  ? ?Past Medical History:  ?Diagnosis Date  ? Arthritis   ? DVT (deep vein thrombosis) 12/2019  ? Hypertension   ? Prostate cancer ([place]) 11/2020  ? 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 ? ?Past Surgical History:  ?Procedure Laterality Date  ? CYSTOSCOPY N/A 05/04/2020  ? Procedure: CYSTOSCOPY;  Surgeon: Cleon Gustin, MD;  Location: [facility];  Service: Urology;  Laterality: N/A;  ? CYSTOSCOPY WITH INSERTION OF UROLIFT N/A 05/04/2020  ? Procedure: CYSTOSCOPY WITH INSERTION OF UROLIFT;  Surgeon: Cleon Gustin, MD;  Location: [facility];  Service: Urology;  Laterality: N/A;  ? CYSTOSCOPY WITH INSERTION OF UROLIFT N/A 10/05/2020  ? Procedure: CYSTOSCOPY WITH INSERTION OF UROLIFT;  Surgeon: Cleon Gustin, MD;  Location: [facility];  Service: Urology;  Laterality: N/A;   ? JOINT REPLACEMENT    ? TOTAL KNEE ARTHROPLASTY Right   ? ? ?Current Outpatient Medications  ?Medication Sig Dispense Refill  ? amLODipine (NORVASC) 10 MG tablet Take 10 mg by mouth daily.    ? benazepril (LOTENSIN) 10 MG tablet Take 10 mg by mouth daily.    ? carvedilol (COREG) 12.5 MG tablet Take 12.5 mg by mouth 2 (two) times daily.    ? chlorthalidone (HYGROTON) 25 MG tablet Take 25 mg by mouth daily.    ? ELIQUIS 5 MG TABS tablet Take 5 mg by mouth 2 (two) times daily.    ? furosemide (LASIX) 40 MG tablet Take 40 mg by mouth daily.    ? losartan (COZAAR) 100 MG tablet Take 100 mg by mouth daily.    ? olmesartan (BENICAR) 40 MG tablet Take 40 mg by mouth daily.    ? spironolactone (ALDACTONE) 25 MG tablet Take 25 mg by mouth 2 (two) times daily.    ? sulfamethoxazole-trimethoprim (BACTRIM DS) 800-160 MG tablet Take 1 tablet by mouth every 12 (twelve) hours. 14 tablet 0  ? tamsulosin (FLOMAX) 0.4 MG CAPS capsule Take 2 capsules (0.8 mg total) by mouth daily. 60 capsule 11  ? traMADol (ULTRAM) 50 MG tablet Take 1 tablet (50 mg total) by mouth every 6 (six) hours as needed. 15 tablet 0  ? ?No current facility-administered medications for this visit.  ? ? ?Allergies as of 03/26/2021  ? (No Known Allergies)  ? ? ?  Family History  ?Problem Relation Age of Onset  ? Colon cancer Neg Hx   ? Colon polyps Neg Hx   ? ? ?Social History  ? ?Socioeconomic History  ? Marital status: Widowed  ?  Spouse name: Not on file  ? Number of children: 1  ? Years of education: Not on file  ? Highest education level: Not on file  ?Occupational History  ? Occupation: retired  ?Tobacco Use  ? Smoking status: Never  ? Smokeless tobacco: Never  ?Vaping Use  ? Vaping Use: Never used  ?Substance and Sexual Activity  ? Alcohol use: Not Currently  ? Drug use: Never  ? Sexual activity: Not Currently  ?Other Topics Concern  ? Not on file  ?Social History Narrative  ? Not on file  ? ?Social Determinants of Health  ? ?Financial Resource Strain: Not  on file  ?Food Insecurity: Not on file  ?Transportation Needs: Not on file  ?Physical Activity: Not on file  ?Stress: Not on file  ?Social Connections: Not on file  ?Intimate Partner Violence: Not on file  ? ? ?Review of Systems: ?Gen: Denies any fever, chills, cold or flulike symptoms, presyncope, syncope. ?CV: Denies chest pain, heart palpitations. ?Resp: Denies shortness of breath or cough. ?GI: See HPI ?GU : Has a catheter. ?MS: Admits to chronic back pain. ?Derm: Denies rash ?Psych: Denies depression, anxiety ?Heme: See HPI ? ?Physical Exam: ?BP (!) 154/72   Pulse 70   Temp 97.7 ?F (36.5 ?C) (Temporal)   Ht '5\' 9"'$  (1.753 m)   Wt 248 lb 3.2 oz (112.6 kg)   BMI 36.65 kg/m?  ?General:   Alert and oriented. Pleasant and cooperative. Well-nourished and well-developed.  Walking with a cane. ?Head:  Normocephalic and atraumatic. ?Eyes:  Without icterus, sclera clear and conjunctiva pink.  ?Ears:  Normal auditory acuity. ?Lungs:  Clear to auscultation bilaterally. No wheezes, rales, or rhonchi. No distress.  ?Heart:  S1, S2 present without murmurs appreciated.  ?Abdomen:  +BS, soft, non-tender and non-distended. No HSM noted. No guarding or rebound. No masses appreciated.  ?Rectal:  Deferred  ?Msk:  Symmetrical without gross deformities. Normal posture. ?Extremities:  With 1-2+ pitting edema about one third way up the shins bilaterally, 1+ pitting edema up to the knee bilaterally. ?Neurologic:  Alert and  oriented x4;  grossly normal neurologically. ?Skin:  Intact without significant lesions or rashes. ?Psych: Normal mood and affect. ? ? ? 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 Sherrie Sport for colon cancer screening.  He has never had a colonoscopy.  No significant GI symptoms.  No alarm symptoms.  No family history of colon cancer. ? ?Notably, he recently saw his oncologist on 3/21 and  was felt to have toxicity associated with Taxotere with interstitial fluid leak in the setting of edema in the legs and thorax.  Taxotere was stopped.  Chest x-ray obtained. Per patient he feels about the same. On exam, he has -2+ pitting edema about one third way up the shins bilaterally, 1+ pitting edema up to the knee bilaterally. No significant edema in the abdomen. Unknown CXR results, but lungs sound clear on exam. Will request CXR for review prior to scheduling colonoscopy.  ? ? ?Plan:  ?Hope to proceed with colonoscopy with propofol with Dr. Abbey Chatters in the near future. The risks, benefits, and alternatives have been discussed with the patient in  detail. The patient states understanding and desires to proceed. ?ASA 3 ?Get okay from Dr. Sherrie Sport to hold Eliquis x48 hours prior to procedure. ?Request recent [facility] from [facility] for review prior to scheduling colonoscopy. ?Follow-up as needed. ? ? ?Aliene Altes, PA-C ?[facility] ?03/26/2021 ?  ?

## 2021-03-26 ENCOUNTER — Telehealth: Payer: Self-pay | Admitting: *Deleted

## 2021-03-26 ENCOUNTER — Ambulatory Visit (INDEPENDENT_AMBULATORY_CARE_PROVIDER_SITE_OTHER): Payer: Medicare HMO | Admitting: Gastroenterology

## 2021-03-26 ENCOUNTER — Encounter: Payer: Self-pay | Admitting: Gastroenterology

## 2021-03-26 ENCOUNTER — Other Ambulatory Visit: Payer: Self-pay

## 2021-03-26 VITALS — BP 154/72 | HR 70 | Temp 97.7°F | Ht 69.0 in | Wt 248.2 lb

## 2021-03-26 DIAGNOSIS — Z1211 Encounter for screening for malignant neoplasm of colon: Secondary | ICD-10-CM

## 2021-03-26 NOTE — Telephone Encounter (Signed)
Faxed medication clearance to Dr. Sherrie Sport office.  ?

## 2021-03-26 NOTE — Telephone Encounter (Signed)
Dr. Sherrie Sport  ? ? ?Attention: Preop ? ? ?We would like to request holding the following medication for patient please. ? ?Procedure:TCS with Propofol   ASA III ? ?Date: TBD ? ?Medication to hold: Eliquis for 48 hours  ? ?Surgeon: Dr. Abbey Chatters  ? ?Phone: [phone] ? ?Fax:  [phone] ? ?Type of Anesthesia: Propofol  ? ? ? ? ? ? ?  ?

## 2021-03-26 NOTE — Patient Instructions (Addendum)
We will arrange for you to have a colonoscopy in the near future with Dr. Abbey Chatters. ?We are reaching out to Dr. Sherrie Sport to get the okay to hold Eliquis for 48 hours prior to your procedure. ?I am also requesting your chest x-ray results for review prior to getting you scheduled. ? ?It was nice meeting you today!  ? ?We will follow up with you as needed.  Do not hesitate to call if you have any new GI concerns. ? ?Aliene Altes, PA-C ?[facility] ? ? ?

## 2021-03-31 ENCOUNTER — Telehealth: Payer: Self-pay | Admitting: *Deleted

## 2021-03-31 DIAGNOSIS — N1831 Chronic kidney disease, stage 3a: Secondary | ICD-10-CM | POA: Diagnosis not present

## 2021-03-31 DIAGNOSIS — I10 Essential (primary) hypertension: Secondary | ICD-10-CM | POA: Diagnosis not present

## 2021-03-31 DIAGNOSIS — I5031 Acute diastolic (congestive) heart failure: Secondary | ICD-10-CM | POA: Diagnosis not present

## 2021-03-31 DIAGNOSIS — Z6841 Body Mass Index (BMI) 40.0 and over, adult: Secondary | ICD-10-CM | POA: Diagnosis not present

## 2021-03-31 DIAGNOSIS — I2699 Other pulmonary embolism without acute cor pulmonale: Secondary | ICD-10-CM | POA: Diagnosis not present

## 2021-03-31 DIAGNOSIS — C61 Malignant neoplasm of prostate: Secondary | ICD-10-CM | POA: Diagnosis not present

## 2021-03-31 NOTE — Telephone Encounter (Signed)
Received okay to hold Eliquis x48 hours. ? ?Also received and reviewed chest x-ray dated 03/25/2021 which revealed no acute cardiopulmonary disease.  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 ? ?[facility] Clinical Pool: ?Proceed with scheduling colonoscopy with propofol with Dr. Abbey Chatters.  ASA 3. Dx: Colon cancer screening. ?Eliquis x48 hours. ?

## 2021-03-31 NOTE — Telephone Encounter (Signed)
Received approval to hold medication. Placed on providers desk.  ?

## 2021-04-01 ENCOUNTER — Other Ambulatory Visit: Payer: Self-pay

## 2021-04-01 MED ORDER — PEG 3350-KCL-NA BICARB-NACL 420 G PO SOLR: 4000.0000 mL | ORAL | 0 refills | Status: DC

## 2021-04-01 NOTE — Telephone Encounter (Signed)
Pre-op appt 4/21. Appt letter mailed with procedure instructions. ?

## 2021-04-01 NOTE — Telephone Encounter (Signed)
Called pt, advised him PCP gave ok for him to hold Eliquis for 48 hours prior to TCS. TCS scheduled for 04/26/21 at 3:00pm. Rx for prep sent to pharmacy. Orders entered. ? ?PA for TCS submitted via [facility] website. PA# [id number], valid 04/26/21-07/25/21. ?

## 2021-04-19 ENCOUNTER — Ambulatory Visit (INDEPENDENT_AMBULATORY_CARE_PROVIDER_SITE_OTHER): Payer: Medicare HMO | Admitting: Physician Assistant

## 2021-04-19 DIAGNOSIS — C7951 Secondary malignant neoplasm of bone: Secondary | ICD-10-CM | POA: Diagnosis not present

## 2021-04-19 DIAGNOSIS — R339 Retention of urine, unspecified: Secondary | ICD-10-CM

## 2021-04-19 DIAGNOSIS — C61 Malignant neoplasm of prostate: Secondary | ICD-10-CM | POA: Diagnosis not present

## 2021-04-19 NOTE — Patient Instructions (Signed)

## 2021-04-19 NOTE — Progress Notes (Signed)
Cath Change/ Replacement ? ?Patient is present today for a catheter change due to urinary retention.  29m of water was removed from the balloon, a 18FR foley cath was removed with out difficulty.  Patient was cleaned and prepped in a sterile fashion with betadine. A 18 FR foley cath was replaced into the bladder no complications were noted Urine return was noted 20 ml and urine was yellow in color. The balloon was filled with 141mof sterile water. A leg bag was attached for drainage.  A night bag was also given to the patient and patient was given instruction on how to change from one bag to another. Patient was given proper instruction on catheter care.   ? ?Performed by: Breion Novacek LPN ? ?Follow up: 1 month cath change ?

## 2021-04-22 NOTE — Patient Instructions (Signed)
? ? ? ? ? ? [facility] ? 04/22/2021  ?  ? '@PREFPERIOPPHARMACY'$ @ ? ? Your procedure is scheduled on  04/26/2021. ? ? Report to [facility] at  1300 (1:00)  P.M. ? ? Call this number if you have problems the morning of surgery: ? [phone] ? ? Remember: ? Follow the diet and prep instructions given to you by the office. ? ?   Your last dose of eliquis should be on 04/23/2021. ? ?  ? Take these medicines the morning of surgery with A SIP OF WATER  ? ?                    Amlodipine, tramadol(if needed), flomax. ?  ? ? Do not wear jewelry, make-up or nail polish. ? Do not wear lotions, powders, or perfumes, or deodorant. ? Do not shave 48 hours prior to surgery.  Men may shave face and neck. ? Do not bring valuables to the hospital. ? [facility] is not responsible for any belongings or valuables. ? ?Contacts, dentures or bridgework may not be worn into surgery.  Leave your suitcase in the car.  After surgery it may be brought to your room. ? ?For patients admitted to the hospital, discharge time will be determined by your treatment team. ? ?Patients discharged the day of surgery will not be allowed to drive home and must have someone with them for 24 hours.  ? ? ?Special instructions:   DO NOT smoke tobacco or vape for 24 hours before your procedure. ? ?Please read over the following fact sheets that you were given. ?Anesthesia Post-op Instructions and Care and Recovery After Surgery ?  ? ? ? Colonoscopy, Adult, Care After ?The following information offers guidance on how to care for yourself after your procedure. Your health care provider may also give you more specific instructions. If you have problems or questions, contact your health care provider. ?What can I expect after the procedure? ?After the procedure, it is common to have: ?A small amount of blood in your stool for 24 hours after the procedure. ?Some gas. ?Mild cramping or bloating of your abdomen. ?Follow these instructions at home: ?Eating and  drinking ? ?Drink enough fluid to keep your urine pale yellow. ?Follow instructions from your health care provider about eating or drinking restrictions. ?Resume your normal diet as told by your health care provider. Avoid heavy or fried foods that are hard to digest. ?Activity ?Rest as told by your health care provider. ?Avoid sitting for a long time without moving. Get up to take short walks every 1-2 hours. This is important to improve blood flow and breathing. Ask for help if you feel weak or unsteady. ?Return to your normal activities as told by your health care provider. Ask your health care provider what activities are safe for you. ?Managing cramping and bloating ? ?Try walking around when you have cramps or feel bloated. ?If directed, apply heat to your abdomen as told by your health care provider. Use the heat source that your health care provider recommends, such as a moist heat pack or a heating pad. ?Place a towel between your skin and the heat source. ?Leave the heat on for 20-30 minutes. ?Remove the heat if your skin turns bright red. This is especially important if you are unable to feel pain, heat, or cold. You have a greater risk of getting burned. ?General instructions ?If you were given a sedative during the procedure, it can affect  you for several hours. Do not drive or operate machinery until your health care provider says that it is safe. ?For the first 24 hours after the procedure: ?Do not sign important documents. ?Do not drink alcohol. ?Do your regular daily activities at a slower pace than normal. ?Eat soft foods that are easy to digest. ?Take over-the-counter and prescription medicines only as told by your health care provider. ?Keep all follow-up visits. This is important. ?Contact a health care provider if: ?You have blood in your stool 2-3 days after the procedure. ?Get help right away if: ?You have more than a small spotting of blood in your stool. ?You have large blood clots in your  stool. ?You have swelling of your abdomen. ?You have nausea or vomiting. ?You have a fever. ?You have increasing pain in your abdomen that is not relieved with medicine. ?These symptoms may be an emergency. Get help right away. Call 911. ?Do not wait to see if the symptoms will go away. ?Do not drive yourself to the hospital. ?Summary ?After the procedure, it is common to have a small amount of blood in your stool. You may also have mild cramping and bloating of your abdomen. ?If you were given a sedative during the procedure, it can affect you for several hours. Do not drive or operate machinery until your health care provider says that it is safe. ?Get help right away if you have a lot of blood in your stool, nausea or vomiting, a fever, or increased pain in your abdomen. ?This information is not intended to replace advice given to you by your health care provider. Make sure you discuss any questions you have with your health care provider. ?Document Revised: 08/12/2020 Document Reviewed: 08/12/2020 ?Elsevier Patient Education ? [place]. ?Monitored Anesthesia Care, Care After ?This sheet gives you information about how to care for yourself after your procedure. Your health care provider may also give you more specific instructions. If you have problems or questions, contact your health care provider. ?What can I expect after the procedure? ?After the procedure, it is common to have: ?Tiredness. ?Forgetfulness about what happened after the procedure. ?Impaired judgment for important decisions. ?Nausea or vomiting. ?Some difficulty with balance. ?Follow these instructions at home: ?For the time period you were told by your health care provider: ? ?  ? ?Rest as needed. ?Do not participate in activities where you could fall or become injured. ?Do not drive or use machinery. ?Do not drink alcohol. ?Do not take sleeping pills or medicines that cause drowsiness. ?Do not make important decisions or sign legal  documents. ?Do not take care of children on your own. ?Eating and drinking ?Follow the diet that is recommended by your health care provider. ?Drink enough fluid to keep your urine pale yellow. ?If you vomit: ?Drink water, juice, or soup when you can drink without vomiting. ?Make sure you have little or no nausea before eating solid foods. ?General instructions ?Have a responsible adult stay with you for the time you are told. It is important to have someone help care for you until you are awake and alert. ?Take over-the-counter and prescription medicines only as told by your health care provider. ?If you have sleep apnea, surgery and certain medicines can increase your risk for breathing problems. Follow instructions from your health care provider about wearing your sleep device: ?Anytime you are sleeping, including during daytime naps. ?While taking prescription pain medicines, sleeping medicines, or medicines that make you drowsy. ?Avoid smoking. ?Keep  all follow-up visits as told by your health care provider. This is important. ?Contact a health care provider if: ?You keep feeling nauseous or you keep vomiting. ?You feel light-headed. ?You are still sleepy or having trouble with balance after 24 hours. ?You develop a rash. ?You have a fever. ?You have redness or swelling around the IV site. ?Get help right away if: ?You have trouble breathing. ?You have new-onset confusion at home. ?Summary ?For several hours after your procedure, you may feel tired. You may also be forgetful and have poor judgment. ?Have a responsible adult stay with you for the time you are told. It is important to have someone help care for you until you are awake and alert. ?Rest as told. Do not drive or operate machinery. Do not drink alcohol or take sleeping pills. ?Get help right away if you have trouble breathing, or if you suddenly become confused. ?This information is not intended to replace advice given to you by your health care  provider. Make sure you discuss any questions you have with your health care provider. ?Document Revised: 11/24/2020 Document Reviewed: 11/22/2018 ?Elsevier Patient Education ? [place]. ? ?

## 2021-04-23 ENCOUNTER — Encounter (HOSPITAL_COMMUNITY): Payer: Self-pay

## 2021-04-23 ENCOUNTER — Encounter (HOSPITAL_COMMUNITY)
Admission: RE | Admit: 2021-04-23 | Discharge: 2021-04-23 | Disposition: A | Discharge status: Home / Self Care | Payer: Medicare HMO | Source: Ambulatory Visit | Attending: Internal Medicine | Admitting: Internal Medicine

## 2021-04-23 ENCOUNTER — Other Ambulatory Visit: Payer: Self-pay

## 2021-04-26 ENCOUNTER — Encounter (HOSPITAL_COMMUNITY)
Admission: RE | Disposition: A | Discharge status: Home / Self Care | Payer: Self-pay | Source: Home / Self Care | Attending: Internal Medicine

## 2021-04-26 ENCOUNTER — Ambulatory Visit (HOSPITAL_COMMUNITY)
Admission: RE | Admit: 2021-04-26 | Discharge: 2021-04-26 | Disposition: A | Discharge status: Home / Self Care | Payer: Medicare HMO | Attending: Internal Medicine | Admitting: Internal Medicine

## 2021-04-26 ENCOUNTER — Ambulatory Visit (HOSPITAL_BASED_OUTPATIENT_CLINIC_OR_DEPARTMENT_OTHER): Payer: Medicare HMO | Admitting: Anesthesiology

## 2021-04-26 ENCOUNTER — Ambulatory Visit (HOSPITAL_COMMUNITY): Payer: Medicare HMO | Admitting: Anesthesiology

## 2021-04-26 ENCOUNTER — Encounter (HOSPITAL_COMMUNITY): Payer: Self-pay

## 2021-04-26 DIAGNOSIS — N289 Disorder of kidney and ureter, unspecified: Secondary | ICD-10-CM

## 2021-04-26 DIAGNOSIS — Z1211 Encounter for screening for malignant neoplasm of colon: Secondary | ICD-10-CM

## 2021-04-26 DIAGNOSIS — D120 Benign neoplasm of cecum: Secondary | ICD-10-CM | POA: Insufficient documentation

## 2021-04-26 DIAGNOSIS — Z86718 Personal history of other venous thrombosis and embolism: Secondary | ICD-10-CM | POA: Diagnosis not present

## 2021-04-26 DIAGNOSIS — K648 Other hemorrhoids: Secondary | ICD-10-CM | POA: Insufficient documentation

## 2021-04-26 DIAGNOSIS — Z79899 Other long term (current) drug therapy: Secondary | ICD-10-CM | POA: Diagnosis not present

## 2021-04-26 DIAGNOSIS — D122 Benign neoplasm of ascending colon: Secondary | ICD-10-CM | POA: Diagnosis not present

## 2021-04-26 DIAGNOSIS — I10 Essential (primary) hypertension: Secondary | ICD-10-CM | POA: Insufficient documentation

## 2021-04-26 DIAGNOSIS — K635 Polyp of colon: Secondary | ICD-10-CM

## 2021-04-26 DIAGNOSIS — K219 Gastro-esophageal reflux disease without esophagitis: Secondary | ICD-10-CM | POA: Insufficient documentation

## 2021-04-26 HISTORY — PX: POLYPECTOMY: SHX5525

## 2021-04-26 HISTORY — PX: COLONOSCOPY WITH PROPOFOL: SHX5780

## 2021-04-26 SURGERY — COLONOSCOPY WITH PROPOFOL
Anesthesia: General

## 2021-04-26 MED ORDER — LACTATED RINGERS IV SOLN
INTRAVENOUS | Status: DC
  Administered 2021-04-26: 1000 mL via INTRAVENOUS

## 2021-04-26 MED ORDER — LIDOCAINE HCL (CARDIAC) PF 100 MG/5ML IV SOSY
PREFILLED_SYRINGE | INTRAVENOUS | Status: DC | PRN
  Administered 2021-04-26: 50 mg via INTRAVENOUS

## 2021-04-26 MED ORDER — PROPOFOL 10 MG/ML IV BOLUS
INTRAVENOUS | Status: DC | PRN
Start: 2021-04-26 — End: 2021-04-26
  Administered 2021-04-26: 100 mg via INTRAVENOUS
  Administered 2021-04-26: 150 mg via INTRAVENOUS
  Administered 2021-04-26: 30 mg via INTRAVENOUS
  Administered 2021-04-26: 50 mg via INTRAVENOUS

## 2021-04-26 MED ORDER — ACETAMINOPHEN 325 MG PO TABS: 650.0000 mg | ORAL_TABLET | Freq: Once | ORAL | Status: DC

## 2021-04-26 NOTE — Anesthesia Postprocedure Evaluation (Signed)
Anesthesia Post Note ? ?Patient: Johnny Fischer ? ?Procedure(s) Performed: COLONOSCOPY WITH PROPOFOL ?POLYPECTOMY ? ?Patient location during evaluation: Phase II ?Anesthesia Type: General ?Level of consciousness: awake and alert and oriented ?Pain management: pain level controlled ?Vital Signs Assessment: post-procedure vital signs reviewed and stable ?Respiratory status: spontaneous breathing, nonlabored ventilation and respiratory function stable ?Cardiovascular status: blood pressure returned to baseline and stable ?Postop Assessment: no apparent nausea or vomiting ?Anesthetic complications: no ?Comments: Patient c/o right wrist pain and felt better before discharge. ? ? ?No notable events documented. ? ? ?Last Vitals:  ?Vitals:  ? 04/26/21 1425 04/26/21 1429  ?BP: (!) 124/47 137/60  ?Pulse: 70 68  ?Resp: 20 18  ?Temp: 37.1 ?C   ?SpO2: 96% 96%  ?  ?Last Pain:  ?Vitals:  ? 04/26/21 1445  ?TempSrc:   ?PainSc: 0-No pain  ? ? ?  ?  ?  ?  ?  ?  ? ?Neriyah Cercone C Carita Sollars ? ? ? ? ?

## 2021-04-26 NOTE — Transfer of Care (Signed)
Immediate Anesthesia Transfer of Care Note ? ?Patient: Johnny Fischer ? ?Procedure(s) Performed: COLONOSCOPY WITH PROPOFOL ?POLYPECTOMY ? ?Patient Location: Short Stay ? ?Anesthesia Type:General ? ?Level of Consciousness: awake ? ?Airway & Oxygen Therapy: Patient Spontanous Breathing ? ?Post-op Assessment: Report given to RN and Post -op Vital signs reviewed and stable ? ?Post vital signs: Reviewed and stable ? ?Last Vitals:  ?Vitals Value Taken Time  ?BP    ?Temp    ?Pulse    ?Resp    ?SpO2    ? ? ?Last Pain:  ?Vitals:  ? 04/26/21 1405  ?TempSrc:   ?PainSc: 0-No pain  ?   ? ?Patients Stated Pain Goal: 8 (04/26/21 1400) ? ?Complications: No notable events documented. ?

## 2021-04-26 NOTE — Anesthesia Preprocedure Evaluation (Addendum)
Anesthesia Evaluation  ?Patient identified by MRN, date of birth, ID band ?Patient awake ? ? ? ?Reviewed: ?Allergy & Precautions, NPO status , Patient's Chart, lab work & pertinent test results ? ?Airway ?Mallampati: III ? ?TM Distance: >3 FB ?Neck ROM: Full ? ?Mouth opening: Limited Mouth Opening ? Dental ? ?(+) Loose, Dental Advisory Given,  ?  ?Pulmonary ?neg pulmonary ROS,  ?  ?Pulmonary exam normal ?breath sounds clear to auscultation ? ? ? ? ? ? Cardiovascular ?hypertension, Pt. on medications ?Normal cardiovascular exam ?Rhythm:Regular Rate:Normal ? ? ?  ?Neuro/Psych ?negative neurological ROS ? negative psych ROS  ? GI/Hepatic ?Neg liver ROS, GERD  Medicated and Controlled,  ?Endo/Other  ?negative endocrine ROS ? Renal/GU ?Renal InsufficiencyRenal disease  ?negative genitourinary ?  ?Musculoskeletal ? ?(+) Arthritis , Osteoarthritis,   ? Abdominal ?  ?Peds ?negative pediatric ROS ?(+)  Hematology ?negative hematology ROS ?(+)   ?Anesthesia Other Findings ? ? Reproductive/Obstetrics ?negative OB ROS ? ?  ? ? ? ? ? ? ? ? ? ? ? ? ? ?  ?  ? ? ? ? ? ? ?Anesthesia Physical ?Anesthesia Plan ? ?ASA: 2 ? ?Anesthesia Plan: General  ? ?Post-op Pain Management: Minimal or no pain anticipated  ? ?Induction: Intravenous ? ?PONV Risk Score and Plan: Propofol infusion ? ?Airway Management Planned: Nasal Cannula and Natural Airway ? ?Additional Equipment:  ? ?Intra-op Plan:  ? ?Post-operative Plan:  ? ?Informed Consent: I have reviewed the patients History and Physical, chart, labs and discussed the procedure including the risks, benefits and alternatives for the proposed anesthesia with the patient or authorized representative who has indicated his/her understanding and acceptance.  ? ? ? ?Dental advisory given ? ?Plan Discussed with: CRNA and Surgeon ? ?Anesthesia Plan Comments:   ? ? ? ? ? ?Anesthesia Quick Evaluation ? ?

## 2021-04-26 NOTE — Discharge Instructions (Signed)
?  Colonoscopy Discharge Instructions  Read the instructions outlined below and refer to this sheet in the next few weeks. These discharge instructions provide you with general information on caring for yourself after you leave the hospital. Your doctor may also give you specific instructions. While your treatment has been planned according to the most current medical practices available, unavoidable complications occasionally occur.   ACTIVITY You may resume your regular activity, but move at a slower pace for the next 24 hours.  Take frequent rest periods for the next 24 hours.  Walking will help get rid of the air and reduce the bloated feeling in your belly (abdomen).  No driving for 24 hours (because of the medicine (anesthesia) used during the test).   Do not sign any important legal documents or operate any machinery for 24 hours (because of the anesthesia used during the test).  NUTRITION Drink plenty of fluids.  You may resume your normal diet as instructed by your doctor.  Begin with a light meal and progress to your normal diet. Heavy or fried foods are harder to digest and may make you feel sick to your stomach (nauseated).  Avoid alcoholic beverages for 24 hours or as instructed.  MEDICATIONS You may resume your normal medications unless your doctor tells you otherwise.  WHAT YOU CAN EXPECT TODAY Some feelings of bloating in the abdomen.  Passage of more gas than usual.  Spotting of blood in your stool or on the toilet paper.  IF YOU HAD POLYPS REMOVED DURING THE COLONOSCOPY: No aspirin products for 7 days or as instructed.  No alcohol for 7 days or as instructed.  Eat a soft diet for the next 24 hours.  FINDING OUT THE RESULTS OF YOUR TEST Not all test results are available during your visit. If your test results are not back during the visit, make an appointment with your caregiver to find out the results. Do not assume everything is normal if you have not heard from your  caregiver or the medical facility. It is important for you to follow up on all of your test results.  SEEK IMMEDIATE MEDICAL ATTENTION IF: You have more than a spotting of blood in your stool.  Your belly is swollen (abdominal distention).  You are nauseated or vomiting.  You have a temperature over 101.  You have abdominal pain or discomfort that is severe or gets worse throughout the day.   Your colonoscopy revealed 3 polyp(s) which I removed successfully. Await pathology results, my office will contact you. I recommend repeating colonoscopy in 5 years for surveillance purposes.   Otherwise follow up with Gi as needed.   I hope you have a great rest of your week!  Lillard Bailon K. Jolleen Seman, D.O. Gastroenterology and Hepatology [facility]  

## 2021-04-26 NOTE — H&P (Signed)
Primary Care Physician:  Neale Burly, MD ?Primary Gastroenterologist:  Dr. Abbey Chatters ? ?Pre-Procedure History & Physical: ?HPI:  Johnny Fischer is a 76 y.o. male is here for first ever colonoscopy for colon cancer screening purposes.  Patient denies any family history of colorectal cancer.  No melena or hematochezia.  No abdominal pain or unintentional weight loss.  No change in bowel habits.  Overall feels well from a GI standpoint. ? ?Past Medical History:  ?Diagnosis Date  ? Arthritis   ? DVT (deep vein thrombosis) 12/2019  ? Hypertension   ? 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 ? ?Past Surgical History:  ?Procedure Laterality Date  ? CYSTOSCOPY N/A 05/04/2020  ? Procedure: CYSTOSCOPY;  Surgeon: Cleon Gustin, MD;  Location: [facility];  Service: Urology;  Laterality: N/A;  ? CYSTOSCOPY WITH INSERTION OF UROLIFT N/A 05/04/2020  ? Procedure: CYSTOSCOPY WITH INSERTION OF UROLIFT;  Surgeon: Cleon Gustin, MD;  Location: [facility];  Service: Urology;  Laterality: N/A;  ? CYSTOSCOPY WITH INSERTION OF UROLIFT N/A 10/05/2020  ? Procedure: CYSTOSCOPY WITH INSERTION OF UROLIFT;  Surgeon: Cleon Gustin, MD;  Location: [facility];  Service: Urology;  Laterality: N/A;  ? JOINT REPLACEMENT    ? TOTAL KNEE ARTHROPLASTY Right   ? ? ?Prior to Admission medications   ?Medication Sig Start Date End Date Taking? Authorizing Provider  ?amLODipine (NORVASC) 10 MG tablet Take 10 mg by mouth daily. 04/28/20   [provider]  ?benazepril (LOTENSIN) 10 MG tablet Take 10 mg by mouth daily. 01/08/20   [provider]  ?carvedilol (COREG) 12.5 MG tablet Take 12.5 mg by mouth 2 (two) times daily. 01/08/20   [provider]  ?chlorthalidone (HYGROTON) 25 MG tablet Take 25 mg by mouth daily. 07/28/20   [provider]  ?ELIQUIS 5 MG TABS tablet Take 5 mg by mouth 2 (two) times daily. 01/28/20   [provider]  ?furosemide (LASIX) 40 MG tablet Take 40 mg by mouth daily.  01/08/20   [provider]  ?losartan (COZAAR) 100 MG tablet Take 100 mg by mouth daily. 04/28/20   [provider]  ?olmesartan (BENICAR) 40 MG tablet Take 40 mg by mouth daily.    [provider]  ?polyethylene glycol-electrolytes (TRILYTE) 420 g solution Take 4,000 mLs by mouth as directed. 04/01/21   Eloise Harman, DO  ?spironolactone (ALDACTONE) 25 MG tablet Take 25 mg by mouth 2 (two) times daily. 01/08/20   [provider]  ?sulfamethoxazole-trimethoprim (BACTRIM DS) 800-160 MG tablet Take 1 tablet by mouth every 12 (twelve) hours. 07/15/20   McKenzie, Candee Furbish, MD  ?tamsulosin (FLOMAX) 0.4 MG CAPS capsule Take 2 capsules (0.8 mg total) by mouth daily. 01/13/21   McKenzie, Candee Furbish, MD  ?traMADol (ULTRAM) 50 MG tablet Take 1 tablet (50 mg total) by mouth every 6 (six) hours as needed. 10/05/20 10/05/21  Cleon Gustin, MD  ? ? ?Allergies as of 04/01/2021  ? (No Known Allergies)  ? ? ?Family History  ?Problem Relation Age of Onset  ? Colon cancer Neg Hx   ? Colon polyps Neg Hx   ? ? ?Social History  ? ?Socioeconomic History  ? Marital status: Widowed  ?  Spouse name: Not on file  ? Number of children: 1  ? Years of education: Not on file  ? Highest education level: Not on file  ?Occupational History  ? Occupation: retired  ?Tobacco Use  ?  Smoking status: Never  ? Smokeless tobacco: Never  ?Vaping Use  ? Vaping Use: Never used  ?Substance and Sexual Activity  ? Alcohol use: Not Currently  ? Drug use: Never  ? Sexual activity: Not Currently  ?Other Topics Concern  ? Not on file  ?Social History Narrative  ? Not on file  ? ?Social Determinants of Health  ? ?Financial Resource Strain: Not on file  ?Food Insecurity: Not on file  ?Transportation Needs: Not on file  ?Physical Activity: Not on file  ?Stress: Not on file  ?Social Connections: Not on file  ?Intimate Partner Violence: Not on file  ? ? ?Review of Systems: ?See HPI, otherwise negative ROS ? ?Physical Exam: ?Vital signs  in last 24 hours: ?  ?  ?General:   Alert,  Well-developed, well-nourished, pleasant and cooperative in NAD ?Head:  Normocephalic and atraumatic. ?Eyes:  Sclera clear, no icterus.   Conjunctiva pink. ?Ears:  Normal auditory acuity. ?Nose:  No deformity, discharge,  or lesions. ?Mouth:  No deformity or lesions, dentition normal. ?Neck:  Supple; no masses or thyromegaly. ?Lungs:  Clear throughout to auscultation.   No wheezes, crackles, or rhonchi. No acute distress. ?Heart:  Regular rate and rhythm; no murmurs, clicks, rubs,  or gallops. ?Abdomen:  Soft, nontender and nondistended. No masses, hepatosplenomegaly or hernias noted. Normal bowel sounds, without guarding, and without rebound.   ?Msk:  Symmetrical without gross deformities. Normal posture. ?Extremities:  Without clubbing or edema. ?Neurologic:  Alert and  oriented x4;  grossly normal neurologically. ?Skin:  Intact without significant lesions or rashes. ?Cervical Nodes:  No significant cervical adenopathy. ?Psych:  Alert and cooperative. Normal mood and affect. ? ?Impression/Plan: ?Johnny Fischer is here for a colonoscopy to be performed for colon cancer screening purposes. ? ?The risks of the procedure including infection, bleed, or perforation as well as benefits, limitations, alternatives and imponderables have been reviewed with the patient. Questions have been answered. All parties agreeable. ? ? ?

## 2021-04-26 NOTE — Op Note (Signed)
[facility] ?Patient Name: Johnny Fischer ?Procedure Date: 04/26/2021 1:51 PM ?MRN: [id number] ?Date of Birth: December 31, 1945 ?Attending MD: Elon Alas. Abbey Chatters , DO ?CSN: [id number] ?Age: 76 ?Admit Type: Outpatient ?Procedure:                Colonoscopy ?Indications:              Screening for colorectal malignant neoplasm ?Providers:                Elon Alas. Abbey Chatters, DO, Tammy Vaught, RN, Nelma Rothman,  ?                          Technician ?Referring MD:              ?Medicines:                See the Anesthesia note for documentation of the  ?                          administered medications ?Complications:            No immediate complications. ?Estimated Blood Loss:     Estimated blood loss was minimal. ?Procedure:                Pre-Anesthesia Assessment: ?                          - The anesthesia plan was to use monitored  ?                          anesthesia care (MAC). ?                          After obtaining informed consent, the colonoscope  ?                          was passed under direct vision. Throughout the  ?                          procedure, the patient's blood pressure, pulse, and  ?                          oxygen saturations were monitored continuously. The  ?                          PCF-HQ190L ([id number]) scope was introduced through  ?                          the anus and advanced to the the cecum, identified  ?                          by appendiceal orifice and ileocecal valve. The  ?                          colonoscopy was performed without difficulty. The  ?                          patient tolerated the procedure well. The  quality  ?                          of the bowel preparation was evaluated using the  ?                          BBPS [facility] Bowel Preparation Scale) with scores  ?                          of: Right Colon = 3, Transverse Colon = 3 and Left  ?                          Colon = 3 (entire mucosa seen well with no residual  ?                          staining, small  fragments of stool or opaque  ?                          liquid). The total BBPS score equals 9. ?Scope In: 2:09:00 PM ?Scope Out: 2:22:46 PM ?Scope Withdrawal Time: 0 hours 9 minutes 16 seconds  ?Total Procedure Duration: 0 hours 13 minutes 46 seconds  ?Findings: ?     The perianal and digital rectal examinations were normal. ?     Non-bleeding internal hemorrhoids were found during endoscopy. ?     Three sessile polyps were found in the ascending colon and cecum. The  ?     polyps were 5 to 8 mm in size. These polyps were removed with a cold  ?     snare. Resection and retrieval were complete. ?     The exam was otherwise without abnormality. ?Impression:               - Non-bleeding internal hemorrhoids. ?                          - Three 5 to 8 mm polyps in the ascending colon and  ?                          in the cecum, removed with a cold snare. Resected  ?                          and retrieved. ?                          - The examination was otherwise normal. ?Moderate Sedation: ?     Per Anesthesia Care ?Recommendation:           - Patient has a contact number available for  ?                          emergencies. The signs and symptoms of potential  ?                          delayed complications were discussed with the  ?  patient. Return to normal activities tomorrow.  ?                          Written discharge instructions were provided to the  ?                          patient. ?                          - Resume previous diet. ?                          - Continue present medications. ?                          - Await pathology results. ?                          - Repeat colonoscopy in 5 years for surveillance. ?                          - Return to GI clinic PRN. ?Procedure Code(s):        --- Professional --- ?                          [phone], Colonoscopy, flexible; with removal of  ?                          tumor(s), polyp(s), or other lesion(s) by snare  ?                           technique ?Diagnosis Code(s):        --- Professional --- ?                          Z12.11, Encounter for screening for malignant  ?                          neoplasm of colon ?                          K63.5, Polyp of colon ?                          K64.8, Other hemorrhoids ?CPT copyright 2019 [employer] Association. All rights reserved. ?The codes documented in this report are preliminary and upon coder review may  ?be revised to meet current compliance requirements. ?Elon Alas. Abbey Chatters, DO ?Elon Alas. Stan[place], DO ?04/26/2021 2:26:09 PM ?This report has been signed electronically. ?Number of Addenda: 0 ?

## 2021-04-27 DIAGNOSIS — C61 Malignant neoplasm of prostate: Secondary | ICD-10-CM | POA: Diagnosis not present

## 2021-04-27 DIAGNOSIS — Z510 Encounter for antineoplastic radiation therapy: Secondary | ICD-10-CM | POA: Diagnosis not present

## 2021-04-27 DIAGNOSIS — C7951 Secondary malignant neoplasm of bone: Secondary | ICD-10-CM | POA: Diagnosis not present

## 2021-04-28 LAB — SURGICAL PATHOLOGY

## 2021-04-29 ENCOUNTER — Encounter (HOSPITAL_COMMUNITY): Payer: Self-pay | Admitting: Internal Medicine

## 2021-05-04 DIAGNOSIS — C7951 Secondary malignant neoplasm of bone: Secondary | ICD-10-CM | POA: Diagnosis not present

## 2021-05-04 DIAGNOSIS — C61 Malignant neoplasm of prostate: Secondary | ICD-10-CM | POA: Diagnosis not present

## 2021-05-12 DIAGNOSIS — I700 Atherosclerosis of aorta: Secondary | ICD-10-CM | POA: Diagnosis not present

## 2021-05-12 DIAGNOSIS — N3289 Other specified disorders of bladder: Secondary | ICD-10-CM | POA: Diagnosis not present

## 2021-05-12 DIAGNOSIS — I2510 Atherosclerotic heart disease of native coronary artery without angina pectoris: Secondary | ICD-10-CM | POA: Diagnosis not present

## 2021-05-12 DIAGNOSIS — G35 Multiple sclerosis: Secondary | ICD-10-CM | POA: Diagnosis not present

## 2021-05-12 DIAGNOSIS — C7951 Secondary malignant neoplasm of bone: Secondary | ICD-10-CM | POA: Diagnosis not present

## 2021-05-12 DIAGNOSIS — N1330 Unspecified hydronephrosis: Secondary | ICD-10-CM | POA: Diagnosis not present

## 2021-05-12 DIAGNOSIS — C61 Malignant neoplasm of prostate: Secondary | ICD-10-CM | POA: Diagnosis not present

## 2021-05-18 ENCOUNTER — Ambulatory Visit: Payer: Medicare HMO | Admitting: Physician Assistant

## 2021-05-20 ENCOUNTER — Ambulatory Visit (INDEPENDENT_AMBULATORY_CARE_PROVIDER_SITE_OTHER): Payer: Medicare HMO | Admitting: Physician Assistant

## 2021-05-20 DIAGNOSIS — R339 Retention of urine, unspecified: Secondary | ICD-10-CM

## 2021-05-20 DIAGNOSIS — Z436 Encounter for attention to other artificial openings of urinary tract: Secondary | ICD-10-CM | POA: Diagnosis not present

## 2021-05-20 NOTE — Progress Notes (Signed)
Cath Change/ Replacement  Patient is present today for a catheter change due to urinary retention.  46m of water was removed from the balloon, a 18FR foley cath was removed with out difficulty.  Patient was cleaned and prepped in a sterile fashion with betadine. A 18 FR foley cath was replaced into the bladder no complications were noted Urine return was noted 556mand urine was yellow in color. The balloon was filled with 1072mf sterile water. A leg bag was attached for drainage.  A night bag was also given to the patient and patient was given instruction on how to change from one bag to another. Patient was given proper instruction on catheter care.    Performed by: KouLevi AlandMA  Follow up: Follow up in 1 month

## 2021-06-03 ENCOUNTER — Ambulatory Visit: Payer: Medicare HMO

## 2021-06-17 ENCOUNTER — Ambulatory Visit: Payer: Medicare HMO | Admitting: Physician Assistant

## 2021-06-18 ENCOUNTER — Ambulatory Visit (INDEPENDENT_AMBULATORY_CARE_PROVIDER_SITE_OTHER): Payer: Medicare HMO | Admitting: Physician Assistant

## 2021-06-18 DIAGNOSIS — R339 Retention of urine, unspecified: Secondary | ICD-10-CM | POA: Diagnosis not present

## 2021-06-18 NOTE — Progress Notes (Signed)
Cath Change/ Replacement  Patient is present today for a catheter change due to urinary retention.  45m of water was removed from the balloon, a 55FR foley cath was removed with out difficulty.  Patient was cleaned and prepped in a sterile fashion with betadine. A 18 FR foley cath was replaced into the bladder no complications were noted Urine return was noted 160mand urine was yellow in color. The balloon was filled with 1046mf sterile water. A leg bag was attached for drainage.  A night bag was also given to the patient and patient was given instruction on how to change from one bag to another. Patient was given proper instruction on catheter care.    Performed by: KouLevi AlandMA  Follow up: Patient states he placed the 55F catheter himself.  Per our last cath change a 2F catheter was placed so 2F placed at this appointment.  Follow up in 1 month for catheter change.

## 2021-06-22 DIAGNOSIS — G893 Neoplasm related pain (acute) (chronic): Secondary | ICD-10-CM | POA: Diagnosis not present

## 2021-06-22 DIAGNOSIS — Z960 Presence of urogenital implants: Secondary | ICD-10-CM | POA: Diagnosis not present

## 2021-06-22 DIAGNOSIS — C7951 Secondary malignant neoplasm of bone: Secondary | ICD-10-CM | POA: Diagnosis not present

## 2021-06-22 DIAGNOSIS — C61 Malignant neoplasm of prostate: Secondary | ICD-10-CM | POA: Diagnosis not present

## 2021-06-22 DIAGNOSIS — D649 Anemia, unspecified: Secondary | ICD-10-CM | POA: Diagnosis not present

## 2021-07-19 ENCOUNTER — Ambulatory Visit (INDEPENDENT_AMBULATORY_CARE_PROVIDER_SITE_OTHER): Payer: Medicare HMO | Admitting: Urology

## 2021-07-19 DIAGNOSIS — R339 Retention of urine, unspecified: Secondary | ICD-10-CM | POA: Diagnosis not present

## 2021-07-19 NOTE — Progress Notes (Signed)
Cath Change/ Replacement  Patient is present today for a catheter change due to urinary retention.  54m of water was removed from the balloon, a 18FR foley cath was removed with out difficulty.  Patient was cleaned and prepped in a sterile fashion with betadine. A 18 FR foley cath was replaced into the bladder no complications were noted Urine return was noted 586mand urine was yellow in color. The balloon was filled with 1043mf sterile water. A leg bag was attached for drainage.  A night bag was also given to the patient and patient was given instruction on how to change from one bag to another. Patient was given proper instruction on catheter care.    Performed by: ShaMarisue BrooklynMA, assisted by AmaEstill BambergN  Follow up: Followed as schedule

## 2021-07-20 DIAGNOSIS — C61 Malignant neoplasm of prostate: Secondary | ICD-10-CM | POA: Diagnosis not present

## 2021-07-20 DIAGNOSIS — R197 Diarrhea, unspecified: Secondary | ICD-10-CM | POA: Diagnosis not present

## 2021-07-20 DIAGNOSIS — M4854XG Collapsed vertebra, not elsewhere classified, thoracic region, subsequent encounter for fracture with delayed healing: Secondary | ICD-10-CM | POA: Diagnosis not present

## 2021-07-20 DIAGNOSIS — G893 Neoplasm related pain (acute) (chronic): Secondary | ICD-10-CM | POA: Diagnosis not present

## 2021-07-20 DIAGNOSIS — R110 Nausea: Secondary | ICD-10-CM | POA: Diagnosis not present

## 2021-07-21 DIAGNOSIS — I5031 Acute diastolic (congestive) heart failure: Secondary | ICD-10-CM | POA: Diagnosis not present

## 2021-07-21 DIAGNOSIS — Z0000 Encounter for general adult medical examination without abnormal findings: Secondary | ICD-10-CM | POA: Diagnosis not present

## 2021-07-21 DIAGNOSIS — Z6841 Body Mass Index (BMI) 40.0 and over, adult: Secondary | ICD-10-CM | POA: Diagnosis not present

## 2021-07-21 DIAGNOSIS — N1831 Chronic kidney disease, stage 3a: Secondary | ICD-10-CM | POA: Diagnosis not present

## 2021-07-21 DIAGNOSIS — I10 Essential (primary) hypertension: Secondary | ICD-10-CM | POA: Diagnosis not present

## 2021-07-21 DIAGNOSIS — C61 Malignant neoplasm of prostate: Secondary | ICD-10-CM | POA: Diagnosis not present

## 2021-07-21 DIAGNOSIS — I2699 Other pulmonary embolism without acute cor pulmonale: Secondary | ICD-10-CM | POA: Diagnosis not present

## 2021-08-06 ENCOUNTER — Other Ambulatory Visit: Payer: Medicare HMO

## 2021-08-09 ENCOUNTER — Other Ambulatory Visit: Payer: Medicare HMO

## 2021-08-11 DIAGNOSIS — G893 Neoplasm related pain (acute) (chronic): Secondary | ICD-10-CM | POA: Diagnosis not present

## 2021-08-11 DIAGNOSIS — C61 Malignant neoplasm of prostate: Secondary | ICD-10-CM | POA: Diagnosis not present

## 2021-08-11 DIAGNOSIS — R197 Diarrhea, unspecified: Secondary | ICD-10-CM | POA: Diagnosis not present

## 2021-08-11 DIAGNOSIS — R110 Nausea: Secondary | ICD-10-CM | POA: Diagnosis not present

## 2021-08-11 DIAGNOSIS — M4854XG Collapsed vertebra, not elsewhere classified, thoracic region, subsequent encounter for fracture with delayed healing: Secondary | ICD-10-CM | POA: Diagnosis not present

## 2021-08-13 ENCOUNTER — Ambulatory Visit: Payer: Medicare HMO | Admitting: Urology

## 2021-08-16 ENCOUNTER — Ambulatory Visit (INDEPENDENT_AMBULATORY_CARE_PROVIDER_SITE_OTHER): Payer: Medicare HMO | Admitting: Urology

## 2021-08-16 ENCOUNTER — Encounter: Payer: Self-pay | Admitting: Urology

## 2021-08-16 VITALS — BP 159/77 | HR 80 | Ht 69.0 in | Wt 260.0 lb

## 2021-08-16 DIAGNOSIS — C61 Malignant neoplasm of prostate: Secondary | ICD-10-CM

## 2021-08-16 DIAGNOSIS — R339 Retention of urine, unspecified: Secondary | ICD-10-CM | POA: Diagnosis not present

## 2021-08-16 MED ORDER — LEUPROLIDE ACETATE (6 MONTH) 45 MG ~~LOC~~ KIT
45.0000 mg | PACK | Freq: Once | SUBCUTANEOUS | Status: AC
  Administered 2021-08-16: 45 mg via SUBCUTANEOUS

## 2021-08-16 NOTE — Progress Notes (Signed)
Eligard SubQ Injection   Due to Prostate Cancer patient is present today for a Eligard Injection.  Medication: Eligard 6 month Dose: 45 mg  Location: right  Lot: [id number] Exp: 11/2022  Patient tolerated well, no complications were noted  Performed by: Levi Aland, CMA

## 2021-08-16 NOTE — Progress Notes (Signed)
Cath Change/ Replacement  Patient is present today for a catheter change due to urinary retention.  110m of water was removed from the balloon, a 18FR foley cath was removed with out difficulty.  Patient was cleaned and prepped in a sterile fashion with betadine. A 18 FR foley cath was replaced into the bladder no complications were noted Urine return was noted 535mand urine was yellow in color. The balloon was filled with 1069mf sterile water. A leg bag was attached for drainage. Patient was given proper instruction on catheter care.    Performed by: KouLevi AlandMA  Follow up: Follow up as scheduled.

## 2021-08-16 NOTE — Patient Instructions (Signed)

## 2021-08-16 NOTE — Addendum Note (Signed)
Addended by: Fredda Hammed R on: 08/16/2021 12:00 PM   Modules accepted: Orders

## 2021-08-16 NOTE — Progress Notes (Signed)
08/16/2021 11:32 AM   Johnny Fischer May 10, 1945 [id number]  Referring provider: Neale Burly, MD [address] [place],  [place] [address]  Followup urinary retention and prostate cancer   HPI: Johnny Fischer is a 76yo here for followup for metastatic prostate cancer and urinary retention. He is doing well with monthly foley changes. He is currently on darolutimide. No hot flashes. Energy is good. No bone pain. He is due for eligard today.    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    Surgical History: Past Surgical History:  Procedure Laterality Date   COLONOSCOPY WITH PROPOFOL N/A 04/26/2021   Procedure: COLONOSCOPY WITH PROPOFOL;  Surgeon: Eloise Harman, DO;  [facility];  Service: Endoscopy;  Laterality: N/A;  3:00pm   CYSTOSCOPY N/A 05/04/2020   Procedure: CYSTOSCOPY;  Surgeon: Cleon Gustin, MD;  Location: [facility];  Service: Urology;  Laterality: N/A;   CYSTOSCOPY WITH INSERTION OF UROLIFT N/A 05/04/2020   Procedure: CYSTOSCOPY WITH INSERTION OF UROLIFT;  Surgeon: Cleon Gustin, MD;  Location: [facility];  Service: Urology;  Laterality: N/A;   CYSTOSCOPY WITH INSERTION OF UROLIFT N/A 10/05/2020   Procedure: CYSTOSCOPY WITH INSERTION OF UROLIFT;  Surgeon: Cleon Gustin, MD;  Location: [facility];  Service: Urology;  Laterality: N/A;   JOINT REPLACEMENT     POLYPECTOMY  04/26/2021   Procedure: POLYPECTOMY;  Surgeon: Eloise Harman, DO;  [facility]: [facility];  Service: Endoscopy;;   TOTAL KNEE ARTHROPLASTY Right     Home Medications:  Allergies as of 08/16/2021   No Known Allergies      Medication List        Accurate as of August 16, 2021 11:32 AM. If you have any questions, ask your nurse or doctor.          amLODipine 10 MG tablet Commonly known as: NORVASC Take 10 mg by mouth daily.    benazepril 10 MG tablet Commonly known as: LOTENSIN Take 10 mg by mouth daily.   carvedilol 12.5 MG tablet Commonly known as: COREG Take 12.5 mg by mouth 2 (two) times daily.   chlorthalidone 25 MG tablet Commonly known as: HYGROTON Take 25 mg by mouth daily.   Eliquis 5 MG Tabs tablet Generic drug: apixaban Take 5 mg by mouth 2 (two) times daily.   furosemide 40 MG tablet Commonly known as: LASIX Take 40 mg by mouth daily.   losartan 100 MG tablet Commonly known as: COZAAR Take 100 mg by mouth daily.   morphine 30 MG tablet Commonly known as: MSIR Take 30 mg by mouth every 4 (four) hours as needed.   olmesartan 40 MG tablet Commonly known as: BENICAR Take 40 mg by mouth daily.   spironolactone 25 MG tablet Commonly known as: ALDACTONE Take 25 mg by mouth 2 (two) times daily.   sulfamethoxazole-trimethoprim 800-160 MG tablet Commonly known as: BACTRIM DS Take 1 tablet by mouth every 12 (twelve) hours.   tamsulosin 0.4 MG Caps capsule Commonly known as: FLOMAX Take 2 capsules (0.8 mg total) by mouth daily.   traMADol 50 MG tablet Commonly known as: Ultram Take 1 tablet (50 mg total) by mouth every 6 (six) hours as needed.        Allergies: No Known Allergies  Family History: Family History  Problem Relation  Age of Onset   Colon cancer Neg Hx    Colon polyps Neg Hx     Social History:  reports that he has never smoked. He has never used smokeless tobacco. He reports that he does not currently use alcohol. He reports that he does not use drugs.  ROS: All other review of systems were reviewed and are negative except what is noted above in HPI  Physical Exam: BP (!) 159/77   Pulse 80   Ht '5\' 9"'$  (1.753 m)   Wt 260 lb (117.9 kg)   BMI 38.40 kg/m   Constitutional:  Alert and oriented, No acute distress. HEENT: [place] AT, moist mucus membranes.  Trachea midline, no masses. Cardiovascular: No clubbing, cyanosis, or edema. Respiratory: Normal respiratory  effort, no increased work of breathing. GI: Abdomen is soft, nontender, nondistended, no abdominal masses GU: No CVA tenderness.  Lymph: No cervical or inguinal lymphadenopathy. Skin: No rashes, bruises or suspicious lesions. Neurologic: Grossly intact, no focal deficits, moving all 4 extremities. Psychiatric: Normal mood and affect.  Laboratory Data: Lab Results  Component Value Date   WBC 7.4 08/26/2020   HGB 11.2 (L) 08/26/2020   HCT 35.8 (L) 08/26/2020   MCV 92.0 08/26/2020   PLT 287 08/26/2020    Lab Results  Component Value Date   CREATININE 1.17 08/26/2020    Lab Results  Component Value Date   PSA 2.22 10/18/2007   PSA 1.51 05/08/2006   PSA NORMAL 03/16/2005    Lab Results  Component Value Date   TESTOSTERONE <3 (L) 02/05/2021    No results found for: "HGBA1C"  Urinalysis    Component Value Date/Time   COLORURINE RED (A) 10/05/2020 2219   APPEARANCEUR Cloudy (A) 10/23/2020 0917   LABSPEC 1.013 10/05/2020 2219   PHURINE 6.0 10/05/2020 2219   GLUCOSEU Negative 10/23/2020 0917   HGBUR MODERATE (A) 10/05/2020 2219   HGBUR negative 07/19/2007 0918   BILIRUBINUR Negative 10/23/2020 0917   KETONESUR NEGATIVE 10/05/2020 2219   PROTEINUR 2+ (A) 10/23/2020 0917   PROTEINUR 100 (A) 10/05/2020 2219   UROBILINOGEN 0.2 12/13/2019 1115   UROBILINOGEN 1.0 07/19/2007 0918   NITRITE Positive (A) 10/23/2020 0917   NITRITE NEGATIVE 10/05/2020 2219   LEUKOCYTESUR 2+ (A) 10/23/2020 0917   LEUKOCYTESUR TRACE (A) 10/05/2020 2219    Lab Results  Component Value Date   LABMICR See below: 10/23/2020   WBCUA >30 (A) 10/23/2020   LABEPIT 0-10 10/23/2020   BACTERIA Many (A) 10/23/2020    Pertinent Imaging: CT 05/12/2021: Images reviewed and discussed with the patient No results found for this or any previous visit.  No results found for this or any previous visit.  No results found for this or any previous visit.  No results found for this or any previous  visit.  No results found for this or any previous visit.  No results found for this or any previous visit.  No results found for this or any previous visit.  No results found for this or any previous visit.   Assessment & Plan:    1. Urinary retention -continue indwelling foley - INSERT,TEMP INDWELLING BLAD CATH,SIMPLE - Foley catheter - discontinue  2. Prostate cancer ([place]) -Eligard '45mg'$  today -RTC 6 months    No follow-ups on file.  Nicolette Bang, MD  [facility] [place]

## 2021-09-20 ENCOUNTER — Ambulatory Visit (INDEPENDENT_AMBULATORY_CARE_PROVIDER_SITE_OTHER): Payer: Medicare HMO | Admitting: Physician Assistant

## 2021-09-20 DIAGNOSIS — C61 Malignant neoplasm of prostate: Secondary | ICD-10-CM

## 2021-09-20 DIAGNOSIS — R339 Retention of urine, unspecified: Secondary | ICD-10-CM | POA: Diagnosis not present

## 2021-09-20 NOTE — Progress Notes (Signed)
Cath Change/ Replacement  Patient is present today for a catheter change due to urinary retention.  10 ml of water was removed from the balloon, a 18 FR foley cath was removed without difficulty.  Patient was cleaned and prepped in a sterile fashion with betadine a A 18 FR foley cath was replaced into the bladder, no complications were noted. Urine return was noted was a scant amount  and urine was yellow in color. The balloon was filled with 73m of sterile water. A leg bag was attached for drainage.  A night bag was also given to the patient and patient was given instruction on how to change from one bag to another. Patient was given proper instruction on catheter care.    Performed by: SMarisue Brooklyn CMA  Follow up: Follow up as scheduled     Provider review: Procedure reviewed. Agree with clinical documentation.   Julienne A Summerlin PA-C

## 2021-10-20 ENCOUNTER — Ambulatory Visit (INDEPENDENT_AMBULATORY_CARE_PROVIDER_SITE_OTHER): Payer: Medicare HMO | Admitting: Physician Assistant

## 2021-10-20 DIAGNOSIS — R339 Retention of urine, unspecified: Secondary | ICD-10-CM

## 2021-10-20 NOTE — Progress Notes (Signed)
Cath Change/ Replacement  Patient is present today for a catheter change due to urinary retention.  41m of water was removed from the balloon, a 18FR foley cath was removed without difficulty.  Patient was cleaned and prepped in a sterile fashion with betadine and 2% lidocaine jelly was instilled into the urethra. A 18 FR foley cath was replaced into the bladder, no complications were noted. Urine return was noted 156mand urine was yellow in color. The balloon was filled with 1057mf sterile water. A leg bag was attached for drainage.  Patient was given proper instruction on catheter care.    Performed by: KouLevi AlandMA  Follow up: Follow up in 1 month.    Procedure reviewed. Agree with clinical documentation.  Julienne A Summerlin PA-C

## 2021-10-28 ENCOUNTER — Other Ambulatory Visit: Payer: Self-pay | Admitting: Urology

## 2021-10-28 DIAGNOSIS — N138 Other obstructive and reflux uropathy: Secondary | ICD-10-CM

## 2021-11-15 DIAGNOSIS — Z86718 Personal history of other venous thrombosis and embolism: Secondary | ICD-10-CM | POA: Diagnosis not present

## 2021-11-15 DIAGNOSIS — K0490 Unspecified diseases of pulp and periapical tissues: Secondary | ICD-10-CM | POA: Diagnosis not present

## 2021-11-15 DIAGNOSIS — I10 Essential (primary) hypertension: Secondary | ICD-10-CM | POA: Diagnosis not present

## 2021-11-15 DIAGNOSIS — C7951 Secondary malignant neoplasm of bone: Secondary | ICD-10-CM | POA: Diagnosis not present

## 2021-11-15 DIAGNOSIS — Z6841 Body Mass Index (BMI) 40.0 and over, adult: Secondary | ICD-10-CM | POA: Diagnosis not present

## 2021-11-15 DIAGNOSIS — C7801 Secondary malignant neoplasm of right lung: Secondary | ICD-10-CM | POA: Diagnosis not present

## 2021-11-15 DIAGNOSIS — M7989 Other specified soft tissue disorders: Secondary | ICD-10-CM | POA: Diagnosis not present

## 2021-11-15 DIAGNOSIS — C7802 Secondary malignant neoplasm of left lung: Secondary | ICD-10-CM | POA: Diagnosis not present

## 2021-11-15 DIAGNOSIS — C61 Malignant neoplasm of prostate: Secondary | ICD-10-CM | POA: Diagnosis not present

## 2021-11-15 DIAGNOSIS — Z95828 Presence of other vascular implants and grafts: Secondary | ICD-10-CM | POA: Diagnosis not present

## 2021-11-16 ENCOUNTER — Encounter: Payer: Self-pay | Admitting: Urology

## 2021-11-16 ENCOUNTER — Ambulatory Visit (INDEPENDENT_AMBULATORY_CARE_PROVIDER_SITE_OTHER): Payer: Medicare HMO | Admitting: Urology

## 2021-11-16 DIAGNOSIS — R339 Retention of urine, unspecified: Secondary | ICD-10-CM | POA: Diagnosis not present

## 2021-11-16 NOTE — Patient Instructions (Signed)

## 2021-11-16 NOTE — Progress Notes (Signed)
Cath Change/ Replacement  Patient is present today for a catheter change due to urinary retention.  52m of water was removed from the balloon, a 18FR foley cath was removed without difficulty.  Patient was cleaned and prepped in a sterile fashion with betadine and 2% lidocaine jelly was instilled into the urethra. A 18 FR foley cath was replaced into the bladder, no complications were noted. Urine return was noted 171mand urine was yellow in color. The balloon was filled with 1024mf sterile water. A 18 bag was attached for drainage.  A night bag was also given to the patient and patient was given instruction on how to change from one bag to another. Patient was given proper instruction on catheter care.    Performed by: Hope LPN  Follow up: 1 month cath change

## 2021-12-09 ENCOUNTER — Other Ambulatory Visit (HOSPITAL_COMMUNITY): Payer: Self-pay | Admitting: Hematology & Oncology

## 2021-12-09 DIAGNOSIS — C61 Malignant neoplasm of prostate: Secondary | ICD-10-CM

## 2021-12-17 ENCOUNTER — Ambulatory Visit: Payer: Medicare HMO

## 2021-12-17 NOTE — Progress Notes (Unsigned)
Patient miss monthly catheter change. Letter sent out.

## 2021-12-20 ENCOUNTER — Telehealth (HOSPITAL_COMMUNITY): Payer: Self-pay | Admitting: Hematology & Oncology

## 2021-12-21 ENCOUNTER — Ambulatory Visit (INDEPENDENT_AMBULATORY_CARE_PROVIDER_SITE_OTHER): Payer: Medicare HMO | Admitting: Urology

## 2021-12-21 ENCOUNTER — Telehealth (HOSPITAL_COMMUNITY): Payer: Self-pay | Admitting: Hematology & Oncology

## 2021-12-21 DIAGNOSIS — R339 Retention of urine, unspecified: Secondary | ICD-10-CM | POA: Diagnosis not present

## 2021-12-21 NOTE — Progress Notes (Signed)
Cath Change/ Replacement  Patient is present today for a catheter change due to urinary retention.  79m of water was removed from the balloon, a 18FR foley cath was removed without difficulty.  Patient was cleaned and prepped in a sterile fashion with betadine and 2% lidocaine jelly was instilled into the urethra. A 18 FR foley cath was replaced into the bladder, no complications were noted. Urine return was noted 124mand urine was yellow in color. The balloon was filled with 1077mf sterile water. A leg bag was attached for drainage.  A night bag was also given to the patient and patient was given instruction on how to change from one bag to another. Patient was given proper instruction on catheter care.    Performed by: AmaEstill Bamberg  Follow up: keep monthly foley catheter changes

## 2022-01-12 ENCOUNTER — Telehealth: Payer: Self-pay

## 2022-01-12 DIAGNOSIS — R339 Retention of urine, unspecified: Secondary | ICD-10-CM

## 2022-01-12 MED ORDER — AMBULATORY NON FORMULARY MEDICATION: 11 refills | Status: DC

## 2022-01-12 NOTE — Telephone Encounter (Signed)
Pharmacy called to request a rx for catheter supplies for patient.  Orders faxed per pharmacy request.

## 2022-01-17 ENCOUNTER — Ambulatory Visit (INDEPENDENT_AMBULATORY_CARE_PROVIDER_SITE_OTHER): Payer: Medicare HMO | Admitting: Urology

## 2022-01-17 DIAGNOSIS — R339 Retention of urine, unspecified: Secondary | ICD-10-CM

## 2022-01-17 NOTE — Progress Notes (Signed)
Cath Change/ Replacement  Patient is present today for a catheter change due to urinary retention.  57m of water was removed from the balloon, a 18FR foley cath was removed without difficulty.  Patient was cleaned and prepped in a sterile fashion with betadine and 2% lidocaine jelly was instilled into the urethra. A 18 FR foley cath was replaced into the bladder, no complications were noted. Urine return was noted 574mand urine was yellow in color. The balloon was filled with 104mf sterile water. A leg bag was attached for drainage.  A night bag was also given to the patient and patient was given instruction on how to change from one bag to another. Patient was given proper instruction on catheter care.    Performed by: ShaMarisue BrooklynMA  Follow up: Keep monthly cath change

## 2022-02-03 DIAGNOSIS — C61 Malignant neoplasm of prostate: Secondary | ICD-10-CM | POA: Diagnosis not present

## 2022-02-03 DIAGNOSIS — C7951 Secondary malignant neoplasm of bone: Secondary | ICD-10-CM | POA: Diagnosis not present

## 2022-02-12 DIAGNOSIS — C61 Malignant neoplasm of prostate: Secondary | ICD-10-CM | POA: Diagnosis not present

## 2022-02-14 DIAGNOSIS — C61 Malignant neoplasm of prostate: Secondary | ICD-10-CM | POA: Diagnosis not present

## 2022-02-14 DIAGNOSIS — M898X8 Other specified disorders of bone, other site: Secondary | ICD-10-CM | POA: Diagnosis not present

## 2022-02-14 DIAGNOSIS — C7951 Secondary malignant neoplasm of bone: Secondary | ICD-10-CM | POA: Diagnosis not present

## 2022-02-16 ENCOUNTER — Ambulatory Visit: Payer: Medicare HMO | Admitting: Urology

## 2022-02-16 VITALS — BP 168/74 | HR 99

## 2022-02-16 DIAGNOSIS — C61 Malignant neoplasm of prostate: Secondary | ICD-10-CM | POA: Diagnosis not present

## 2022-02-16 DIAGNOSIS — R339 Retention of urine, unspecified: Secondary | ICD-10-CM | POA: Diagnosis not present

## 2022-02-16 MED ORDER — LEUPROLIDE ACETATE (6 MONTH) 45 MG ~~LOC~~ KIT
45.0000 mg | PACK | Freq: Once | SUBCUTANEOUS | Status: AC
  Administered 2022-02-16: 45 mg via SUBCUTANEOUS

## 2022-02-16 NOTE — Progress Notes (Cosign Needed Addendum)
Cath Change/ Replacement  Patient is present today for a catheter change due to urinary retention.  65m of water was removed from the balloon, a 18FR foley cath was removed without difficulty.  Patient was cleaned and prepped in a sterile fashion with betadine and 2% lidocaine jelly was instilled into the urethra. A 18 FR foley cath was replaced into the bladder, no complications were noted. Urine return was noted 130mand urine was yellow in color. The balloon was filled with 1073mf sterile water. A leg bag was attached for drainage.  A night bag was also given to the patient and patient was given instruction on how to change from one bag to another. Patient was given proper instruction on catheter care.    Performed by: KouLevi AlandMA  Follow up: Follow up as scheduled.

## 2022-02-16 NOTE — Progress Notes (Signed)
Eligard SubQ Injection   Due to Prostate Cancer patient is present today for a Eligard Injection.  Medication: Eligard 6 month Dose: 45 mg  Location: left  Lot: [id number] Exp: [id number]  Patient tolerated well, no complications were noted  Performed by: Yousof Alderman, CMA  

## 2022-02-16 NOTE — Progress Notes (Signed)
02/16/2022 1:48 PM   Johnny Fischer 12/28/1945 [id number]  Referring provider: Neale Burly, MD [address] [place],  [place] [phone]  Followup prostate cancer and urinary retention   HPI: Johnny Fischer is a 77yo here for followup for metastatic prostate cancer and urinary retention. His urinary retention is currently managed with an indwelling foley. He has monthly foley changes without incident PSA 405. He is scheduled for bone scan. He is currently seeing medical oncology at [facility]. He denies any worsening bone pain  PMH: Past Medical History:  Diagnosis Date   Arthritis    DVT (deep vein thrombosis) 12/2019   Hypertension    Prostate cancer ([place]) 11/2020   Metastatic to lungs and bones    Surgical History: Past Surgical History:  Procedure Laterality Date   COLONOSCOPY WITH PROPOFOL N/A 04/26/2021   Procedure: COLONOSCOPY WITH PROPOFOL;  Surgeon: Eloise Harman, DO;  [facility];  Service: Endoscopy;  Laterality: N/A;  3:00pm   CYSTOSCOPY N/A 05/04/2020   Procedure: CYSTOSCOPY;  Surgeon: Cleon Gustin, MD;  Location: [facility];  Service: Urology;  Laterality: N/A;   CYSTOSCOPY WITH INSERTION OF UROLIFT N/A 05/04/2020   Procedure: CYSTOSCOPY WITH INSERTION OF UROLIFT;  Surgeon: Cleon Gustin, MD;  Location: [facility] OR[facility];  Service: Urology;  Laterality: N/A;   CYSTOSCOPY WITH INSERTION OF UROLIFT N/A 10/05/2020   Procedure: CYSTOSCOPY WITH INSERTION OF UROLIFT;  Surgeon: Cleon Gustin, MD;  Location: [facility];  Service: Urology;  Laterality: N/A;   JOINT REPLACEMENT     POLYPECTOMY  04/26/2021   Procedure: POLYPECTOMY;  Surgeon: Eloise Harman, DO;  [facility];  Service: Endoscopy;;   TOTAL KNEE ARTHROPLASTY Right     Home Medications:  Allergies as of 02/16/2022   No Known Allergies      Medication List        Accurate as of February 16, 2022  1:48 PM. If you have any questions, ask your nurse or doctor.           AMBULATORY NON FORMULARY MEDICATION Catheter closed sytem kit   AMBULATORY NON FORMULARY MEDICATION Urinary catheter leg bags   amLODipine 10 MG tablet Commonly known as: NORVASC Take 10 mg by mouth daily.   benazepril 10 MG tablet Commonly known as: LOTENSIN Take 10 mg by mouth daily.   carvedilol 12.5 MG tablet Commonly known as: COREG Take 12.5 mg by mouth 2 (two) times daily.   chlorthalidone 25 MG tablet Commonly known as: HYGROTON Take 25 mg by mouth daily.   Eliquis 5 MG Tabs tablet Generic drug: apixaban Take 5 mg by mouth 2 (two) times daily.   furosemide 40 MG tablet Commonly known as: LASIX Take 40 mg by mouth daily.   losartan 100 MG tablet Commonly known as: COZAAR Take 100 mg by mouth daily.   morphine 30 MG tablet Commonly known as: MSIR Take 30 mg by mouth every 4 (four) hours as needed.   olmesartan 40 MG tablet Commonly known as: BENICAR Take 40 mg by mouth daily.   spironolactone 25 MG tablet Commonly known as: ALDACTONE Take 25 mg by mouth 2 (two) times daily.   sulfamethoxazole-trimethoprim 800-160 MG tablet Commonly known as: BACTRIM DS Take 1 tablet by mouth every 12 (twelve) hours.   tamsulosin 0.4 MG Caps capsule Commonly known as: FLOMAX TAKE TWO (2) CAPSULES BY MOUTH DAILY        Allergies: No Known Allergies  Family History:  Family History  Problem Relation Age of Onset   Colon cancer Neg Hx    Colon polyps Neg Hx     Social History:  reports that he has never smoked. He has never used smokeless tobacco. He reports that he does not currently use alcohol. He reports that he does not use drugs.  ROS: All other review of systems were reviewed and are negative except what is noted above in HPI  Physical Exam: BP (!) 168/74   Pulse 99   Constitutional:  Alert and oriented, No acute distress. HEENT: [place] AT, moist mucus membranes.  Trachea midline, no masses. Cardiovascular: No clubbing, cyanosis, or  edema. Respiratory: Normal respiratory effort, no increased work of breathing. GI: Abdomen is soft, nontender, nondistended, no abdominal masses GU: No CVA tenderness.  Lymph: No cervical or inguinal lymphadenopathy. Skin: No rashes, bruises or suspicious lesions. Neurologic: Grossly intact, no focal deficits, moving all 4 extremities. Psychiatric: Normal mood and affect.  Laboratory Data: Lab Results  Component Value Date   WBC 7.4 08/26/2020   HGB 11.2 (L) 08/26/2020   HCT 35.8 (L) 08/26/2020   MCV 92.0 08/26/2020   PLT 287 08/26/2020    Lab Results  Component Value Date   CREATININE 1.17 08/26/2020    Lab Results  Component Value Date   PSA 2.22 10/18/2007   PSA 1.51 05/08/2006   PSA NORMAL 03/16/2005    Lab Results  Component Value Date   TESTOSTERONE <3 (L) 02/05/2021    No results found for: "HGBA1C"  Urinalysis    Component Value Date/Time   COLORURINE RED (A) 10/05/2020 2219   APPEARANCEUR Cloudy (A) 10/23/2020 0917   LABSPEC 1.013 10/05/2020 2219   PHURINE 6.0 10/05/2020 2219   GLUCOSEU Negative 10/23/2020 0917   HGBUR MODERATE (A) 10/05/2020 2219   HGBUR negative 07/19/2007 0918   BILIRUBINUR Negative 10/23/2020 0917   KETONESUR NEGATIVE 10/05/2020 2219   PROTEINUR 2+ (A) 10/23/2020 0917   PROTEINUR 100 (A) 10/05/2020 2219   UROBILINOGEN 0.2 12/13/2019 1115   UROBILINOGEN 1.0 07/19/2007 0918   NITRITE Positive (A) 10/23/2020 0917   NITRITE NEGATIVE 10/05/2020 2219   LEUKOCYTESUR 2+ (A) 10/23/2020 0917   LEUKOCYTESUR TRACE (A) 10/05/2020 2219    Lab Results  Component Value Date   LABMICR See below: 10/23/2020   WBCUA >30 (A) 10/23/2020   LABEPIT 0-10 10/23/2020   BACTERIA Many (A) 10/23/2020    Pertinent Imaging:  No results found for this or any previous visit.  No results found for this or any previous visit.  No results found for this or any previous visit.  No results found for this or any previous visit.  No results found  for this or any previous visit.  No valid procedures specified. No results found for this or any previous visit.  No results found for this or any previous visit.   Assessment & Plan:    1. Urinary retention -continue monthly foley changes - INSERT,TEMP INDWELLING BLAD CATH,SIMPLE  2. Prostate cancer ([place]) -eligard 76m today -followup 6 months   No follow-ups on file.  PNicolette Bang MD  [facility] [place]

## 2022-02-17 DIAGNOSIS — C61 Malignant neoplasm of prostate: Secondary | ICD-10-CM | POA: Diagnosis not present

## 2022-02-18 ENCOUNTER — Encounter: Payer: Self-pay | Admitting: Urology

## 2022-02-18 NOTE — Patient Instructions (Signed)
Acute Urinary Retention, Male  Acute urinary retention is when a person cannot pee (urinate) at all, or can only pee a little. This can come on all of a sudden. If it is not treated, it can lead to kidney problems or other serious problems. What are the causes? A problem with the tube that drains the bladder (urethra). Problems with the nerves in the bladder. Tumors. Certain medicines. An infection. Having trouble pooping (constipation). What increases the risk? Older men are more at risk because their prostate gland may become larger as they age. Other conditions also can increase risk. These include: Diseases, such as multiple sclerosis. Injury to the spinal cord. Diabetes. A condition that affects the way the brain works, such as dementia. Holding back urine due to trauma or because you do not want to use the bathroom. What are the signs or symptoms? Trouble peeing. Pain in the lower belly. How is this treated? Treatment for this condition may include: Medicines. Placing a thin, germ-free tube (catheter) into the bladder to drain pee out of the body. Therapy to treat mental health conditions. Treatment for conditions that may cause this. If needed, you may be treated in the hospital for kidney problems or to manage other problems. Follow these instructions at home: Medicines Take over-the-counter and prescription medicines only as told by your doctor. Ask your doctor what medicines you should stay away from. If you were given an antibiotic medicine, take it as told by your doctor. Do not stop taking it, even if you start to feel better. General instructions Do not smoke or use any products that contain nicotine or tobacco. If you need help quitting, ask your doctor. Drink enough fluid to keep your pee pale yellow. If you were sent home with a tube that drains the bladder, take care of it as told by your doctor. Watch for changes in your symptoms. Tell your doctor about them. If  told, keep track of changes in your blood pressure at home. Tell your doctor about them. Keep all follow-up visits. Contact a doctor if: You have spasms in your bladder that you cannot stop. You leak pee when you have spasms. Get help right away if: You have chills or a fever. You have blood in your pee. You have a tube that drains pee from the bladder and these things happen: The tube stops draining pee. The tube falls out. Summary Acute urinary retention is when you cannot pee at all or you pee too little. If this condition is not treated, it can lead to kidney problems or other serious problems. If you were sent home with a tube (catheter) that drains the bladder, take care of it as told by your doctor. Watch for changes in your symptoms. Tell your doctor about them. This information is not intended to replace advice given to you by your health care provider. Make sure you discuss any questions you have with your health care provider. Document Revised: 09/11/2019 Document Reviewed: 09/11/2019 Elsevier Patient Education  [place].

## 2022-02-24 ENCOUNTER — Encounter (HOSPITAL_COMMUNITY)
Admission: RE | Admit: 2022-02-24 | Discharge: 2022-02-24 | Disposition: A | Discharge status: Home / Self Care | Payer: Medicare HMO | Source: Ambulatory Visit | Attending: Hematology & Oncology | Admitting: Hematology & Oncology

## 2022-02-24 DIAGNOSIS — N2889 Other specified disorders of kidney and ureter: Secondary | ICD-10-CM | POA: Diagnosis not present

## 2022-02-24 DIAGNOSIS — C61 Malignant neoplasm of prostate: Secondary | ICD-10-CM

## 2022-02-24 DIAGNOSIS — N281 Cyst of kidney, acquired: Secondary | ICD-10-CM | POA: Diagnosis not present

## 2022-02-24 DIAGNOSIS — C7951 Secondary malignant neoplasm of bone: Secondary | ICD-10-CM | POA: Diagnosis not present

## 2022-02-24 MED ORDER — PIFLIFOLASTAT F 18 (PYLARIFY) INJECTION
9.0000 | Freq: Once | INTRAVENOUS | Status: AC
  Administered 2022-02-24: 9 via INTRAVENOUS

## 2022-02-28 DIAGNOSIS — C61 Malignant neoplasm of prostate: Secondary | ICD-10-CM | POA: Diagnosis not present

## 2022-03-01 DIAGNOSIS — C7951 Secondary malignant neoplasm of bone: Secondary | ICD-10-CM | POA: Diagnosis not present

## 2022-03-01 DIAGNOSIS — C61 Malignant neoplasm of prostate: Secondary | ICD-10-CM | POA: Diagnosis not present

## 2022-03-09 DIAGNOSIS — C61 Malignant neoplasm of prostate: Secondary | ICD-10-CM | POA: Diagnosis not present

## 2022-03-14 ENCOUNTER — Ambulatory Visit: Payer: Medicare HMO

## 2022-03-18 ENCOUNTER — Ambulatory Visit (INDEPENDENT_AMBULATORY_CARE_PROVIDER_SITE_OTHER): Payer: Medicare HMO | Admitting: Urology

## 2022-03-18 DIAGNOSIS — R339 Retention of urine, unspecified: Secondary | ICD-10-CM

## 2022-03-18 DIAGNOSIS — C61 Malignant neoplasm of prostate: Secondary | ICD-10-CM | POA: Diagnosis not present

## 2022-03-18 NOTE — Progress Notes (Signed)
Cath Change/ Replacement  Patient is present today for a catheter change due to urinary retention.  10 ml of water was removed from the balloon, a 18 FR foley cath was removed without difficulty.  Patient was cleaned and prepped in a sterile fashion with betadine. A 18 FR foley cath was replaced into the bladder, no complications were noted. Urine return was noted 5 ml and urine was dark yellow in color.  Patient is aware to increase fluid intake.The balloon was filled with 10 ml of sterile water. A leg bag was attached for drainage.  A night bag was also given to the patient and patient was given instruction on how to change from one bag to another. Patient was given proper instruction on catheter care.    Performed by: Marisue Brooklyn, CMA  Follow up: Keep scheduled NV

## 2022-03-29 DIAGNOSIS — C61 Malignant neoplasm of prostate: Secondary | ICD-10-CM | POA: Diagnosis not present

## 2022-03-29 DIAGNOSIS — Z9221 Personal history of antineoplastic chemotherapy: Secondary | ICD-10-CM | POA: Diagnosis not present

## 2022-04-06 ENCOUNTER — Other Ambulatory Visit (HOSPITAL_COMMUNITY): Payer: Self-pay | Admitting: Medical Oncology

## 2022-04-06 DIAGNOSIS — C61 Malignant neoplasm of prostate: Secondary | ICD-10-CM

## 2022-04-14 ENCOUNTER — Ambulatory Visit (INDEPENDENT_AMBULATORY_CARE_PROVIDER_SITE_OTHER): Payer: Medicare HMO | Admitting: Urology

## 2022-04-14 DIAGNOSIS — R339 Retention of urine, unspecified: Secondary | ICD-10-CM | POA: Diagnosis not present

## 2022-04-14 NOTE — Progress Notes (Addendum)
Cath Change/ Replacement  Patient is present today for a catheter change due to urinary retention.  10 ml of water was removed from the balloon, a 18 FR foley cath was removed without difficulty.  Patient was cleaned and prepped in a sterile fashion with betadine. A 18 FR foley cath was replaced into the bladder, no complications were noted. Urine return was noted 22ml and urine was dark yellow in color. The balloon was filled with 11ml of sterile water. A leg bag was attached for drainage.  A night bag was also given to the patient and patient was given instruction on how to change from one bag to another. Patient was given proper instruction on catheter care.    Performed by: Kennyth Lose, CMA  Follow up: Keep scheduled Nurse visit

## 2022-05-03 DIAGNOSIS — C61 Malignant neoplasm of prostate: Secondary | ICD-10-CM | POA: Diagnosis not present

## 2022-05-10 DIAGNOSIS — C7951 Secondary malignant neoplasm of bone: Secondary | ICD-10-CM | POA: Diagnosis not present

## 2022-05-10 DIAGNOSIS — C61 Malignant neoplasm of prostate: Secondary | ICD-10-CM | POA: Diagnosis not present

## 2022-05-17 ENCOUNTER — Ambulatory Visit: Payer: Medicare HMO

## 2022-05-23 ENCOUNTER — Ambulatory Visit (INDEPENDENT_AMBULATORY_CARE_PROVIDER_SITE_OTHER): Payer: Medicare HMO | Admitting: Urology

## 2022-05-23 DIAGNOSIS — R339 Retention of urine, unspecified: Secondary | ICD-10-CM

## 2022-05-23 NOTE — Progress Notes (Signed)
Cath Change/ Replacement  Patient is present today for a catheter change due to urinary retention.  8 ml of water was removed from the balloon, a 18 FR foley cath was removed without difficulty.  Patient was cleaned and prepped in a sterile fashion with betadine . A 18 FR foley cath was replaced into the bladder, no complications were noted. Urine return was noted 20ml and urine was yellow in color. The balloon was filled with 10ml of sterile water. A leg bag was attached for drainage.  Patient was given proper instruction on catheter care.    Performed by: Maryland Pink RMA  Follow up: keep scheduled Nurse visit

## 2022-06-16 ENCOUNTER — Ambulatory Visit (INDEPENDENT_AMBULATORY_CARE_PROVIDER_SITE_OTHER): Payer: Medicare HMO

## 2022-06-16 DIAGNOSIS — R339 Retention of urine, unspecified: Secondary | ICD-10-CM | POA: Diagnosis not present

## 2022-06-16 NOTE — Progress Notes (Signed)
Simple Catheter Placement  Due to urinary retention patient is present today for a foley cath placement.  Patient was cleaned and prepped in a sterile fashion with betadine. A 18 FR foley catheter was inserted, urine return was noted  5ml, urine was dark yellow in color.  The balloon was filled with 10cc of sterile water.  A leg bag was attached for drainage. Patient was given instruction on proper catheter care.  Patient tolerated well, no complications were noted   Performed by: Kennyth Lose, CMA  Additional notes/ Follow up: No follow-ups on file.

## 2022-06-28 DIAGNOSIS — C61 Malignant neoplasm of prostate: Secondary | ICD-10-CM | POA: Diagnosis not present

## 2022-07-06 DIAGNOSIS — C61 Malignant neoplasm of prostate: Secondary | ICD-10-CM | POA: Diagnosis not present

## 2022-07-18 ENCOUNTER — Ambulatory Visit (INDEPENDENT_AMBULATORY_CARE_PROVIDER_SITE_OTHER): Payer: Medicare HMO

## 2022-07-18 DIAGNOSIS — R339 Retention of urine, unspecified: Secondary | ICD-10-CM | POA: Diagnosis not present

## 2022-07-18 MED ORDER — CIPROFLOXACIN HCL 500 MG PO TABS
500.0000 mg | ORAL_TABLET | Freq: Once | ORAL | Status: AC
Start: 2022-07-18 — End: 2022-07-18
  Administered 2022-07-18: 500 mg via ORAL

## 2022-07-18 NOTE — Patient Instructions (Signed)

## 2022-07-18 NOTE — Progress Notes (Signed)
Simple Catheter Placement  Due to urinary retention patient is present today for a foley cath placement.  Patient was cleaned and prepped in a sterile fashion with betadine and 2% lidocaine jelly was instilled into the urethra. A 18 FR foley catheter was inserted, urine return was noted  20ml, urine was yellow in color.  The balloon was filled with 10cc of sterile water.  A leg bag was attached for drainage. Patient was also given a night bag to take home and was given instruction on how to change from one bag to another.  Patient was given instruction on proper catheter care.  Patient tolerated well, no complications were noted   Performed by: Anija Brickner LPN  Additional notes/ Follow up: keep scheduled NV

## 2022-07-19 DIAGNOSIS — C61 Malignant neoplasm of prostate: Secondary | ICD-10-CM | POA: Diagnosis not present

## 2022-08-15 ENCOUNTER — Other Ambulatory Visit: Payer: Medicare HMO

## 2022-08-16 ENCOUNTER — Ambulatory Visit: Payer: Medicare HMO

## 2022-08-16 ENCOUNTER — Other Ambulatory Visit: Payer: Medicare HMO

## 2022-08-16 DIAGNOSIS — R339 Retention of urine, unspecified: Secondary | ICD-10-CM | POA: Diagnosis not present

## 2022-08-16 DIAGNOSIS — R972 Elevated prostate specific antigen [PSA]: Secondary | ICD-10-CM | POA: Diagnosis not present

## 2022-08-16 NOTE — Progress Notes (Signed)
Cath Change/ Replacement  Patient is present today for a catheter change due to urinary retention.  65m of water was removed from the balloon, a 18FR foley cath was removed without difficulty.  Patient was cleaned and prepped in a sterile fashion with betadine and 2% lidocaine jelly was instilled into the urethra. A 18 FR foley cath was replaced into the bladder, no complications were noted. Urine return was noted 130mand urine was yellow in color. The balloon was filled with 1073mf sterile water. A leg bag was attached for drainage.  A night bag was also given to the patient and patient was given instruction on how to change from one bag to another. Patient was given proper instruction on catheter care.    Performed by: KouLevi AlandMA  Follow up: Follow up as scheduled.

## 2022-08-24 ENCOUNTER — Ambulatory Visit (INDEPENDENT_AMBULATORY_CARE_PROVIDER_SITE_OTHER): Payer: Medicare HMO | Admitting: Urology

## 2022-08-24 VITALS — BP 171/76 | HR 59

## 2022-08-24 DIAGNOSIS — R339 Retention of urine, unspecified: Secondary | ICD-10-CM | POA: Diagnosis not present

## 2022-08-24 DIAGNOSIS — C61 Malignant neoplasm of prostate: Secondary | ICD-10-CM

## 2022-08-24 MED ORDER — LEUPROLIDE ACETATE (6 MONTH) 45 MG ~~LOC~~ KIT
45.0000 mg | PACK | Freq: Once | SUBCUTANEOUS | Status: AC
Start: 2022-08-24 — End: 2022-08-24
  Administered 2022-08-24: 45 mg via SUBCUTANEOUS

## 2022-08-24 NOTE — Progress Notes (Signed)
08/24/2022 1:43 PM   Johnny Fischer 1945/11/23 [id number]  Referring provider: Toma Deiters, MD [address] [place],  [place] [address]  Followup urinary retention and prostate cancer  HPI: Johnny Fischer is a 77yo here for followup for urinary retention and prostate cancer. PSA 14.5. He is having increased bilateral breast tenderness. He is doing well with a chronic indwelling foley   PMH: Past Medical History:  Diagnosis Date   Arthritis    DVT (deep vein thrombosis) 12/2019   Hypertension    Prostate cancer ([facility]) 11/2020   Metastatic to lungs and bones    Surgical History: Past Surgical History:  Procedure Laterality Date   COLONOSCOPY WITH PROPOFOL N/A 04/26/2021   Procedure: COLONOSCOPY WITH PROPOFOL;  Surgeon: Lanelle Bal, DO;  [facility];  Service: Endoscopy;  Laterality: N/A;  3:00pm   CYSTOSCOPY N/A 05/04/2020   Procedure: CYSTOSCOPY;  Surgeon: Malen Gauze, MD;  Location: [facility];  Service: Urology;  Laterality: N/A;   CYSTOSCOPY WITH INSERTION OF UROLIFT N/A 05/04/2020   Procedure: CYSTOSCOPY WITH INSERTION OF UROLIFT;  Surgeon: Malen Gauze, MD;  Location: [facility];  Service: Urology;  Laterality: N/A;   CYSTOSCOPY WITH INSERTION OF UROLIFT N/A 10/05/2020   Procedure: CYSTOSCOPY WITH INSERTION OF UROLIFT;  Surgeon: Malen Gauze, MD;  Location: [facility];  Service: Urology;  Laterality: N/A;   JOINT REPLACEMENT     POLYPECTOMY  04/26/2021   Procedure: POLYPECTOMY;  Surgeon: Lanelle Bal, DO;  [facility];  Service: Endoscopy;;   TOTAL KNEE ARTHROPLASTY Right     Home Medications:  Allergies as of 08/24/2022   No Known Allergies      Medication List        Accurate as of August 24, 2022  1:43 PM. If you have any questions, ask your nurse or doctor.          AMBULATORY NON FORMULARY MEDICATION Catheter closed sytem kit   AMBULATORY NON FORMULARY MEDICATION Urinary catheter leg bags   amLODipine  10 MG tablet Commonly known as: NORVASC Take 10 mg by mouth daily.   benazepril 10 MG tablet Commonly known as: LOTENSIN Take 10 mg by mouth daily.   carvedilol 12.5 MG tablet Commonly known as: COREG Take 12.5 mg by mouth 2 (two) times daily.   chlorthalidone 25 MG tablet Commonly known as: HYGROTON Take 25 mg by mouth daily.   Eliquis 5 MG Tabs tablet Generic drug: apixaban Take 5 mg by mouth 2 (two) times daily.   furosemide 40 MG tablet Commonly known as: LASIX Take 40 mg by mouth daily.   losartan 100 MG tablet Commonly known as: COZAAR Take 100 mg by mouth daily.   morphine 30 MG tablet Commonly known as: MSIR Take 30 mg by mouth every 4 (four) hours as needed.   olmesartan 40 MG tablet Commonly known as: BENICAR Take 40 mg by mouth daily.   spironolactone 25 MG tablet Commonly known as: ALDACTONE Take 25 mg by mouth 2 (two) times daily.   sulfamethoxazole-trimethoprim 800-160 MG tablet Commonly known as: BACTRIM DS Take 1 tablet by mouth every 12 (twelve) hours.   tamsulosin 0.4 MG Caps capsule Commonly known as: FLOMAX TAKE TWO (2) CAPSULES BY MOUTH DAILY        Allergies: No Known Allergies  Family History: Family History  Problem Relation Age of Onset   Colon cancer Neg Hx    Colon polyps Neg Hx  Social History:  reports that he has never smoked. He has never used smokeless tobacco. He reports that he does not currently use alcohol. He reports that he does not use drugs.  ROS: All other review of systems were reviewed and are negative except what is noted above in HPI  Physical Exam: BP (!) 171/76   Pulse (!) 59   Constitutional:  Alert and oriented, No acute distress. HEENT: [place] AT, moist mucus membranes.  Trachea midline, no masses. Cardiovascular: No clubbing, cyanosis, or edema. Respiratory: Normal respiratory effort, no increased work of breathing. GI: Abdomen is soft, nontender, nondistended, no abdominal masses GU: No CVA  tenderness.  Lymph: No cervical or inguinal lymphadenopathy. Skin: No rashes, bruises or suspicious lesions. Neurologic: Grossly intact, no focal deficits, moving all 4 extremities. Psychiatric: Normal mood and affect.  Laboratory Data: Lab Results  Component Value Date   WBC 7.4 08/26/2020   HGB 11.2 (L) 08/26/2020   HCT 35.8 (L) 08/26/2020   MCV 92.0 08/26/2020   PLT 287 08/26/2020    Lab Results  Component Value Date   CREATININE 1.17 08/26/2020    Lab Results  Component Value Date   PSA 2.22 10/18/2007   PSA 1.51 05/08/2006   PSA NORMAL 03/16/2005    Lab Results  Component Value Date   TESTOSTERONE <3 (L) 02/05/2021    No results found for: "HGBA1C"  Urinalysis    Component Value Date/Time   COLORURINE RED (A) 10/05/2020 2219   APPEARANCEUR Cloudy (A) 10/23/2020 0917   LABSPEC 1.013 10/05/2020 2219   PHURINE 6.0 10/05/2020 2219   GLUCOSEU Negative 10/23/2020 0917   HGBUR MODERATE (A) 10/05/2020 2219   HGBUR negative 07/19/2007 0918   BILIRUBINUR Negative 10/23/2020 0917   KETONESUR NEGATIVE 10/05/2020 2219   PROTEINUR 2+ (A) 10/23/2020 0917   PROTEINUR 100 (A) 10/05/2020 2219   UROBILINOGEN 0.2 12/13/2019 1115   UROBILINOGEN 1.0 07/19/2007 0918   NITRITE Positive (A) 10/23/2020 0917   NITRITE NEGATIVE 10/05/2020 2219   LEUKOCYTESUR 2+ (A) 10/23/2020 0917   LEUKOCYTESUR TRACE (A) 10/05/2020 2219    Lab Results  Component Value Date   LABMICR See below: 10/23/2020   WBCUA >30 (A) 10/23/2020   LABEPIT 0-10 10/23/2020   BACTERIA Many (A) 10/23/2020    Pertinent Imaging:  No results found for this or any previous visit.  No results found for this or any previous visit.  No results found for this or any previous visit.  No results found for this or any previous visit.  No results found for this or any previous visit.  No valid procedures specified. No results found for this or any previous visit.  No results found for this or any previous  visit.   Assessment & Plan:    1. Prostate cancer ([facility]) -Followup 6 months with PSA. COntinue eligard 45mg  every 6 months. Referral to radiation oncology for gynecomastia.   2. Urinary retention Continue indwelling foley   No follow-ups on file.  Wilkie Aye, MD  [facility] [place]

## 2022-08-30 ENCOUNTER — Encounter: Payer: Self-pay | Admitting: Urology

## 2022-08-30 NOTE — Patient Instructions (Signed)

## 2022-09-15 ENCOUNTER — Ambulatory Visit (INDEPENDENT_AMBULATORY_CARE_PROVIDER_SITE_OTHER): Payer: Medicare HMO

## 2022-09-15 DIAGNOSIS — R339 Retention of urine, unspecified: Secondary | ICD-10-CM

## 2022-09-15 MED ORDER — CIPROFLOXACIN HCL 500 MG PO TABS
500.0000 mg | ORAL_TABLET | Freq: Once | ORAL | Status: AC
Start: 2022-09-15 — End: 2022-09-15
  Administered 2022-09-15: 500 mg via ORAL

## 2022-09-15 NOTE — Progress Notes (Signed)
Cath Change/ Replacement  Patient is present today for a catheter change due to urinary retention.  10ml of water was removed from the balloon, a 18FR foley cath was removed without difficulty.  Patient was cleaned and prepped in a sterile fashion with betadine and 2% lidocaine jelly was instilled into the urethra. A 18 FR foley cath was replaced into the bladder, no complications were noted. Urine return was noted 5ml and urine was yellow in color. The balloon was filled with 10ml of sterile water. A leg bag was attached for drainage.  A night bag was also given to the patient and patient was given instruction on how to change from one bag to another. Patient was given proper instruction on catheter care.    Performed by: Kennyth Lose, CMA  Follow up: No follow-ups on file.

## 2022-10-09 ENCOUNTER — Encounter (HOSPITAL_COMMUNITY): Payer: Self-pay

## 2022-10-09 ENCOUNTER — Inpatient Hospital Stay: Admit: 2022-10-09 | Payer: Medicare HMO | Admitting: Internal Medicine

## 2022-10-10 IMAGING — MR MR LUMBAR SPINE WO/W CM
7 of 9 series · 31 of 48 positions shown · IV contrast (10 ml Gadavist)
Comparison: MRI of the lumbar spine October 13, 2008.  In

CLINICAL DATA: Low back pain, cauda equina syndrome suspected.

EXAM:
MRI LUMBAR SPINE WITHOUT AND WITH CONTRAST
TECHNIQUE: Multiplanar and multiecho pulse sequences of the lumbar spine were
obtained without and with intravenous contrast.
CONTRAST:  10mL GADAVIST GADOBUTROL 1 MMOL/ML IV SOLN

[Series 5: T1 · sagittal · 4.0mm · 0.81mm/px · 3 of 17 slices shown (1 of 2)]
[im 1/17]
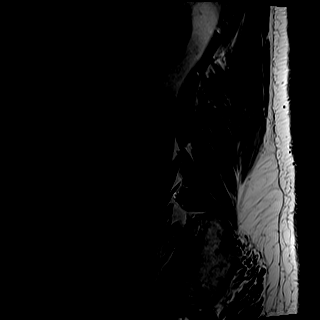
[im 9/17]
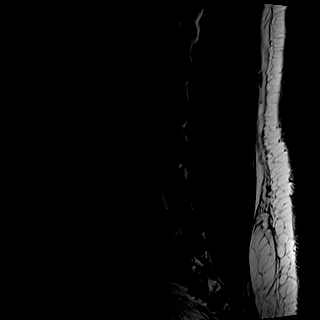
[im 17/17]
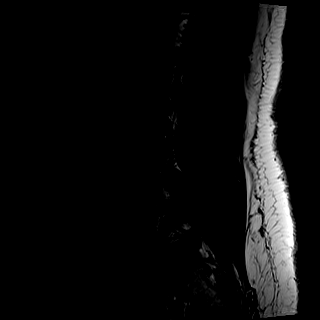

[Series 6: STIR · sagittal · 4.0mm · 0.51mm/px · 2 of 17 slices shown]
[im 1/17]
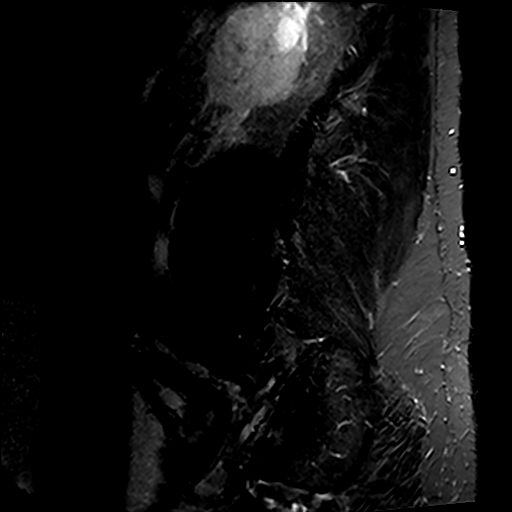
[im 9/17]
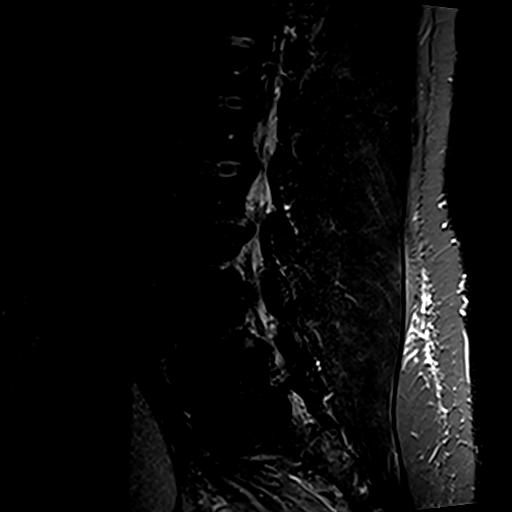

[Series 7: T2 · axial · 4.0mm · 0.70mm/px · z∈[-121,+46]mm · 6 of 28 slices shown (1 of 3)]
[im 1/28]
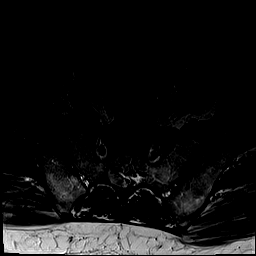
[im 6/28]
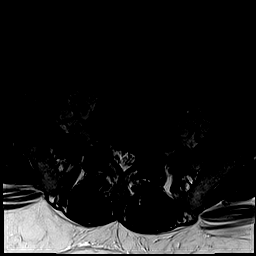
[im 11/28]
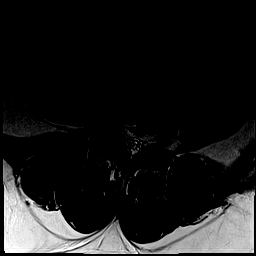
[im 17/28]
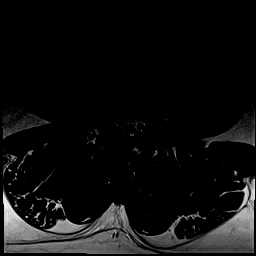
[im 22/28]
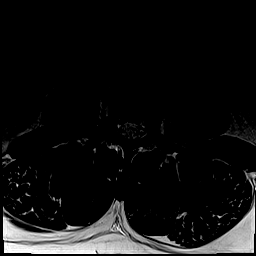
[im 28/28]
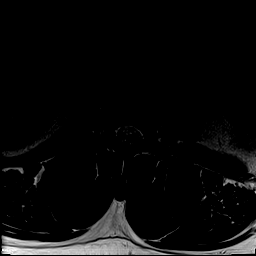

[Series 8: T1 · axial · 4.0mm · 0.35mm/px · z∈[-121,+46]mm · 6 of 28 slices shown (2 of 2)]
[im 1/28]
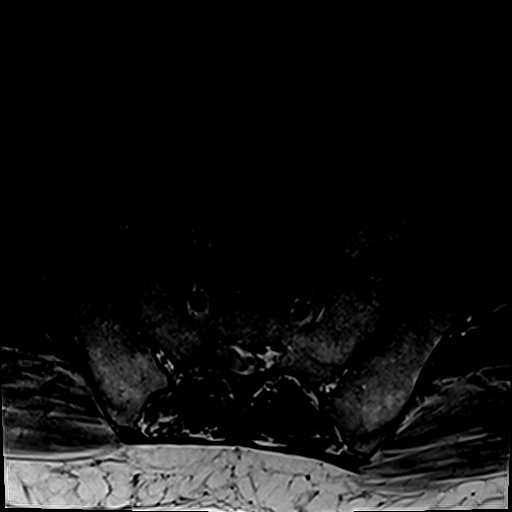
[im 6/28]
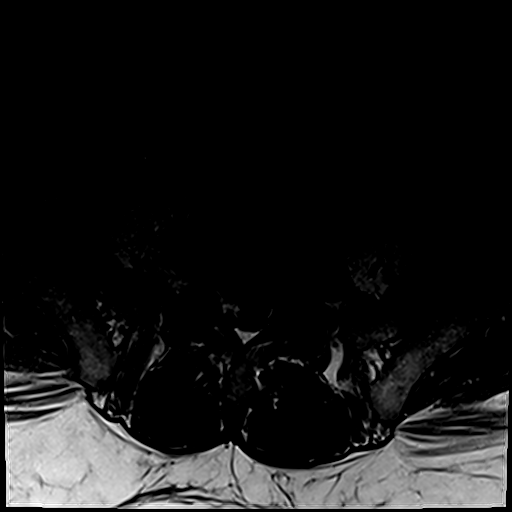
[im 11/28]
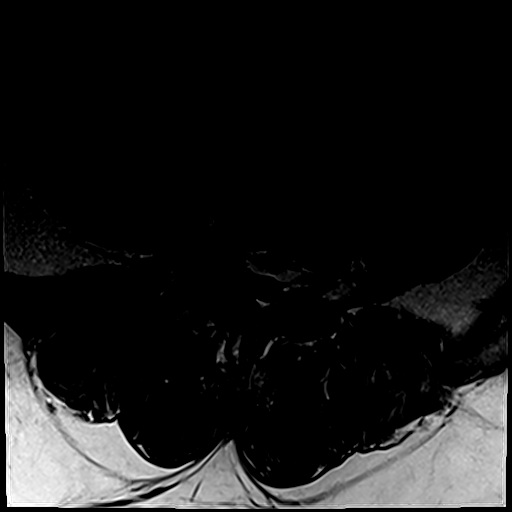
[im 17/28]
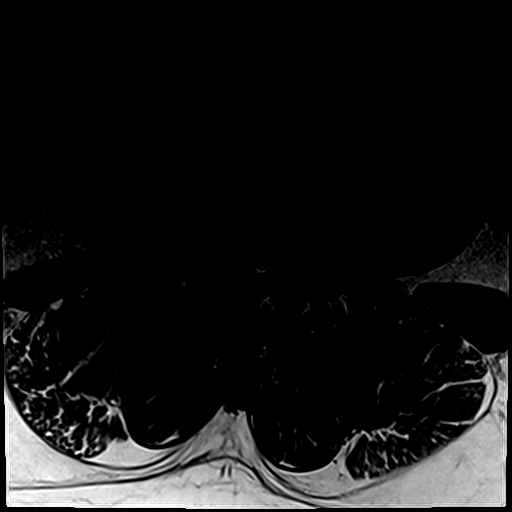
[im 22/28]
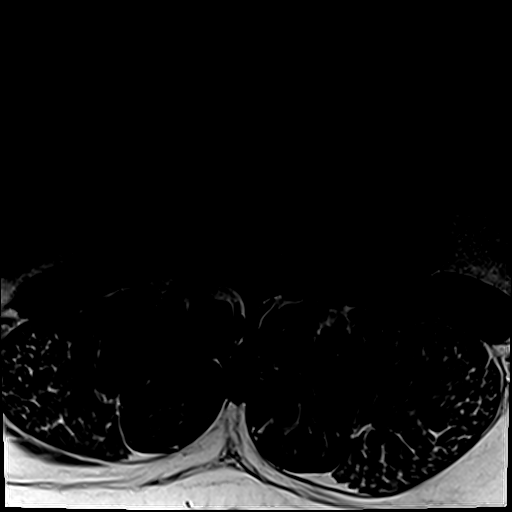
[im 28/28]
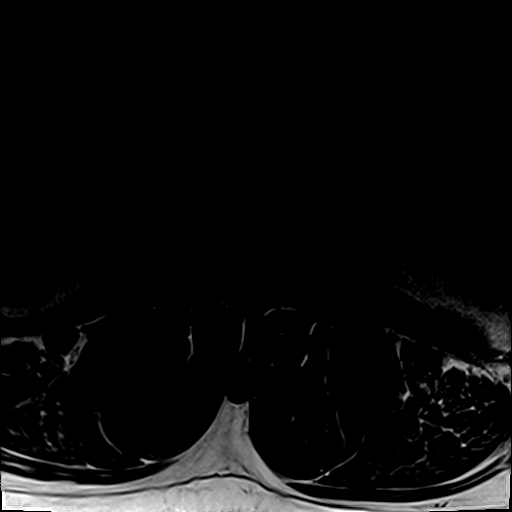

[Series 9: T2 · coronal · 4.0mm · 1.09mm/px · 6 of 26 slices shown (2 of 3)]
[im 1/26]
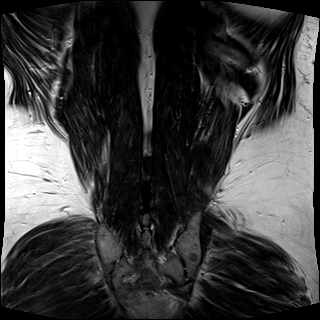
[im 6/26]
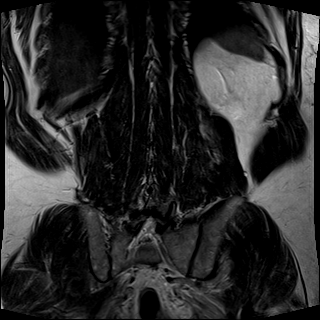
[im 11/26]
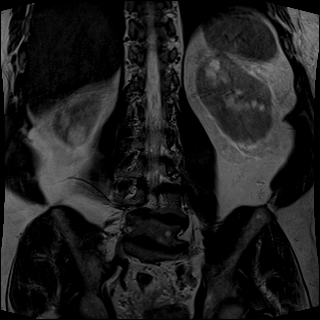
[im 16/26]
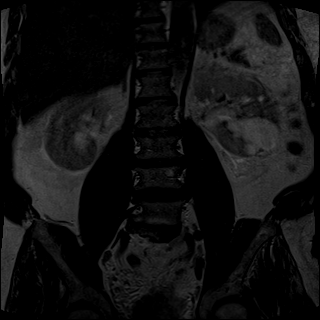
[im 21/26]
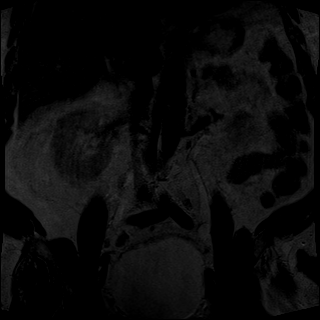
[im 26/26]
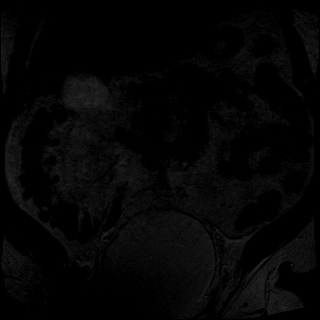

[Series 12: T2 · sagittal · 4.0mm · 0.68mm/px · 4 of 17 slices shown (3 of 3)]
[im 1/17]
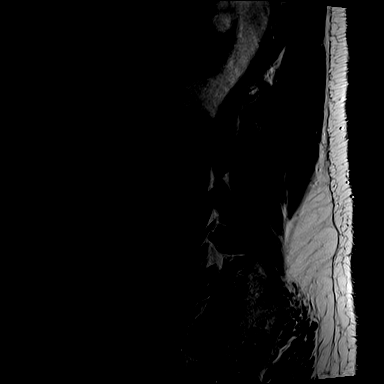
[im 6/17]
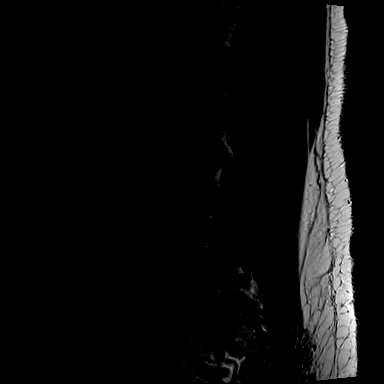
[im 11/17]
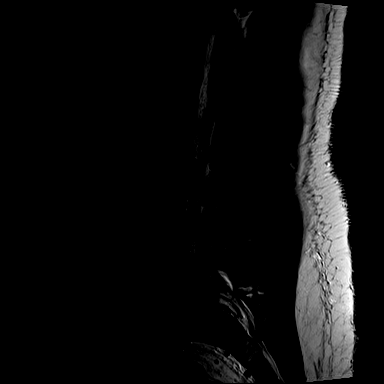
[im 17/17]
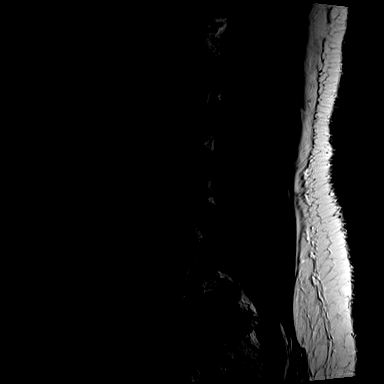

[Series 13: T1 fat-sat post-contrast · sagittal · 4.0mm · 0.81mm/px · 4 of 17 slices shown]
[im 1/17]
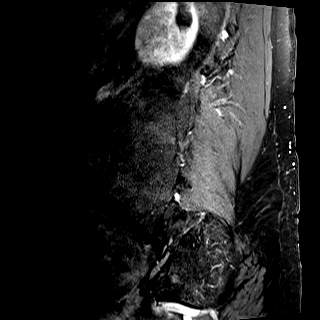
[im 6/17]
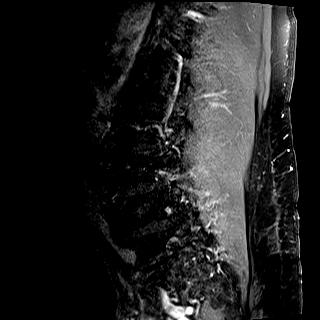
[im 11/17]
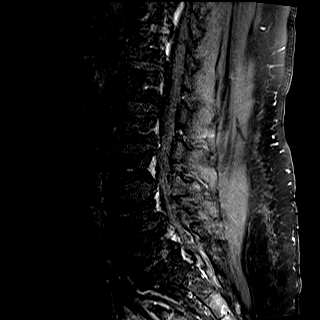
[im 17/17]
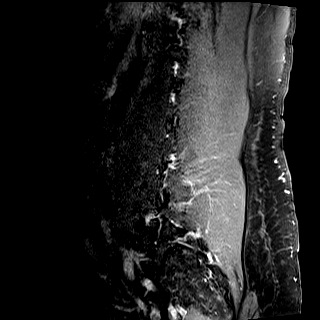

[31 of 48 positions shown; findings below may reference images not displayed]

FINDINGS: Segmentation:  Standard.

Alignment: Levoscoliosis of the lumbar spine. Grade 1
anterolisthesis of L4 over L5.

Vertebrae: Partially visualized on sagittal images is a T1
hypointense, enhancing lesion involving S3 and the visualized S4
with small epidural component, concerning for malignancy. A soft
tissue enhancing lesion is seen posterior to the sacrum on the left,
also only partially visualized on the sagittal images. No suspicious
bone lesion in the lumbar vertebrae. Endplate degenerative changes
at L4-5 and L5-S1.

Conus medullaris and cauda equina: Conus extends to the L1 level.
Conus and cauda equina appear normal.

Paraspinal and other soft tissues: Right renal cyst.

Disc levels:

T12-L1: No spinal canal or neural foraminal stenosis.

L1-2: Disc bulge, facet degenerative changes and ligamentum flavum
redundancy without significant spinal canal or neural foraminal
stenosis.

L2-3: Disc bulge, facet degenerative changes and ligamentum flavum
redundancy resulting in mild spinal canal stenosis and mild
bilateral neural foraminal narrowing.

L3-4: Disc bulge with superimposed left subarticular disc
protrusion, facet degenerative changes and prominent ligamentum
flavum redundancy resulting in severe spinal canal stenosis and mild
left neural foraminal narrowing.

L4-5: Right asymmetric disc bulge, facet degenerative changes and
prominent ligamentum flavum redundancy resulting in moderate spinal
canal stenosis, severe right and mild left neural foraminal
narrowing.

L5-S1: Disc bulge with superimposed osteophytic component, facet
degenerative changes and ligamentum flavum redundancy resulting in
narrowing of the bilateral subarticular zones, left greater than
mild right and severe left neural foraminal narrowing.
IMPRESSION: 1. Partially visualized T1 hypointense, enhancing lesion involving
the S3 and visualized S4 with small epidural component, concerning
for malignancy. A soft tissue enhancing lesion is seen posterior to
the sacrum on the left, also only partially visualized on the
sagittal images.
2. Further evaluation with dedicated MRI of the pelvis/sacrum is
recommended.
3. Multilevel degenerative changes of the lumbar spine with severe
spinal canal stenosis at L3-4 and moderate spinal canal stenosis at
L4-5.
4. Severe right neural foraminal narrowing at L4-5 and severe left
neural foraminal narrowing at L5-S1.

These results will be called to the ordering clinician or
representative by the Radiologist Assistant, and communication
documented in the PACS or [REDACTED].

## 2022-10-18 ENCOUNTER — Ambulatory Visit: Payer: Medicare HMO

## 2022-10-27 ENCOUNTER — Ambulatory Visit: Payer: Medicare HMO

## 2022-11-01 ENCOUNTER — Ambulatory Visit: Payer: Medicare HMO

## 2022-11-04 DEATH — deceased

## 2022-11-09 ENCOUNTER — Ambulatory Visit: Payer: Medicare HMO

## 2022-12-05 ENCOUNTER — Ambulatory Visit: Payer: Self-pay

## 2023-02-16 ENCOUNTER — Other Ambulatory Visit: Payer: Medicare HMO

## 2023-02-22 ENCOUNTER — Ambulatory Visit: Payer: Medicare HMO | Admitting: Urology
